# Patient Record
Sex: Male | Born: 1955 | Race: White | Hispanic: No | Smoking: Never smoker
Health system: Southern US, Community
[De-identification: ages and names within clinical notes are randomized; demographics above are authoritative.]

## PROBLEM LIST (undated history)

## (undated) DIAGNOSIS — I10 Essential (primary) hypertension: Secondary | ICD-10-CM

## (undated) DIAGNOSIS — G40909 Epilepsy, unspecified, not intractable, without status epilepticus: Secondary | ICD-10-CM

## (undated) HISTORY — PX: WRIST ARTHROCENTESIS: SUR48

## (undated) HISTORY — DX: Epilepsy, unspecified, not intractable, without status epilepticus: G40.909

## (undated) HISTORY — DX: Essential (primary) hypertension: I10

---

## 2013-03-22 DIAGNOSIS — N529 Male erectile dysfunction, unspecified: Secondary | ICD-10-CM

## 2013-03-22 HISTORY — DX: Male erectile dysfunction, unspecified: N52.9

## 2015-08-10 DIAGNOSIS — K219 Gastro-esophageal reflux disease without esophagitis: Secondary | ICD-10-CM

## 2015-08-10 HISTORY — DX: Gastro-esophageal reflux disease without esophagitis: K21.9

## 2015-10-13 DIAGNOSIS — I4510 Unspecified right bundle-branch block: Secondary | ICD-10-CM

## 2015-10-13 HISTORY — DX: Unspecified right bundle-branch block: I45.10

## 2016-09-03 DIAGNOSIS — N131 Hydronephrosis with ureteral stricture, not elsewhere classified: Secondary | ICD-10-CM | POA: Insufficient documentation

## 2016-09-03 DIAGNOSIS — N200 Calculus of kidney: Secondary | ICD-10-CM | POA: Insufficient documentation

## 2016-09-03 DIAGNOSIS — N201 Calculus of ureter: Secondary | ICD-10-CM

## 2016-09-03 HISTORY — DX: Calculus of kidney: N20.0

## 2016-09-03 HISTORY — DX: Calculus of ureter: N20.1

## 2016-09-03 HISTORY — DX: Hydronephrosis with ureteral stricture, not elsewhere classified: N13.1

## 2017-02-06 ENCOUNTER — Other Ambulatory Visit: Payer: Self-pay | Admitting: Urology

## 2017-02-06 DIAGNOSIS — Z87442 Personal history of urinary calculi: Secondary | ICD-10-CM

## 2017-02-06 DIAGNOSIS — R109 Unspecified abdominal pain: Secondary | ICD-10-CM

## 2017-02-06 DIAGNOSIS — R319 Hematuria, unspecified: Secondary | ICD-10-CM

## 2017-02-06 HISTORY — DX: Personal history of urinary calculi: Z87.442

## 2017-02-07 ENCOUNTER — Ambulatory Visit
Admission: RE | Admit: 2017-02-07 | Discharge: 2017-02-07 | Disposition: A | Discharge status: Home / Self Care | Payer: Managed Care, Other (non HMO) | Source: Ambulatory Visit | Attending: Urology | Admitting: Urology

## 2017-02-07 ENCOUNTER — Other Ambulatory Visit: Payer: Self-pay | Admitting: Urology

## 2017-02-07 DIAGNOSIS — Z87442 Personal history of urinary calculi: Secondary | ICD-10-CM

## 2017-02-07 DIAGNOSIS — R319 Hematuria, unspecified: Secondary | ICD-10-CM

## 2017-02-07 DIAGNOSIS — R109 Unspecified abdominal pain: Secondary | ICD-10-CM

## 2017-12-17 IMAGING — CT CT ABD-PELV W/O CM
1 of 2 series · 15 of 32 positions shown, 19 images · non-contrast
Comparison: None.

CLINICAL DATA: Left flank pain and hematuria for 3 days.
Nephrolithiasis.

EXAM:
CT ABDOMEN AND PELVIS WITHOUT CONTRAST
TECHNIQUE: Multidetector CT imaging of the abdomen and pelvis was performed
following the standard protocol without IV contrast.

[Series 2: renal standard/full · axial · 0.85mm/px · z∈[-565,-55]mm · 15 of 112 slices shown, 19 images]
[im 5/112  soft-tissue]
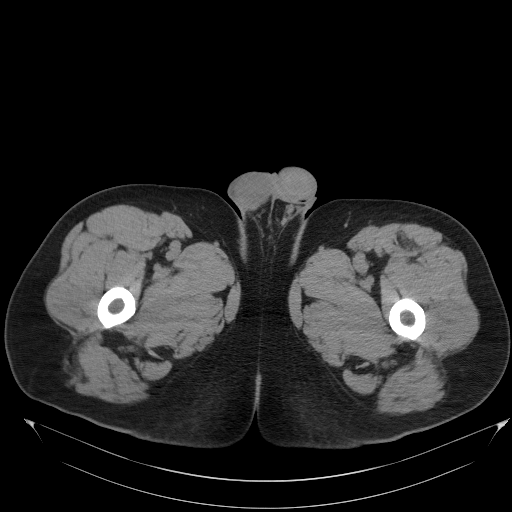
[im 5/112  bone]
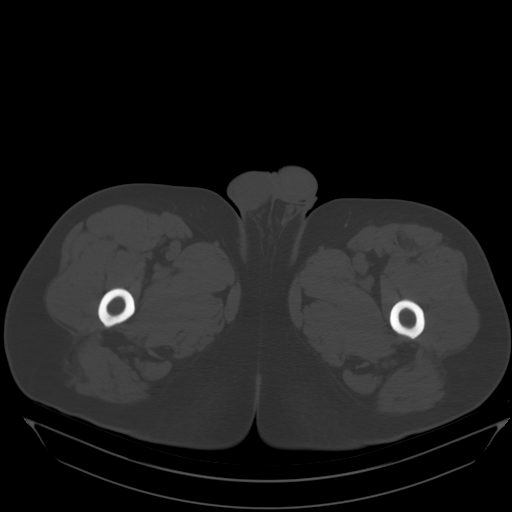
[im 13/112  soft-tissue]
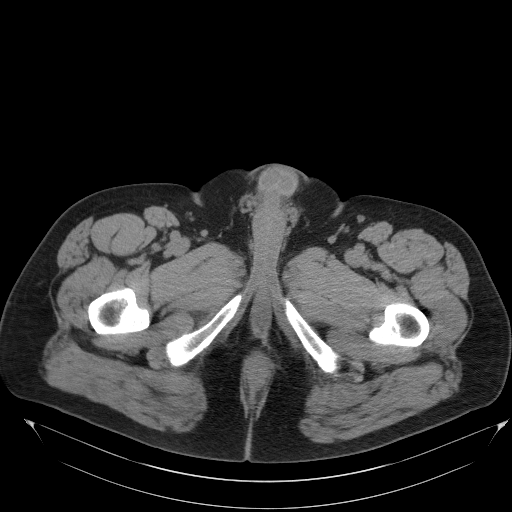
[im 22/112  soft-tissue]
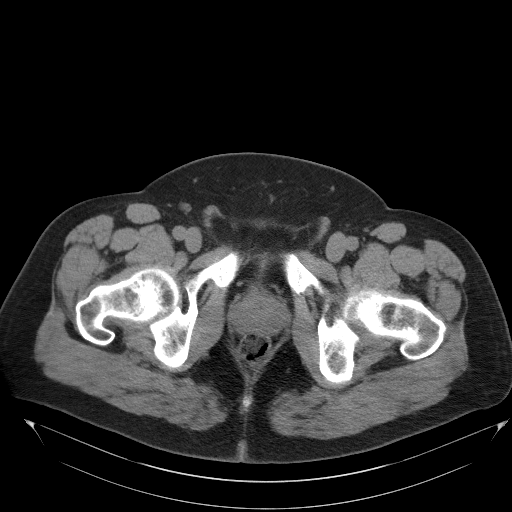
[im 30/112  soft-tissue]
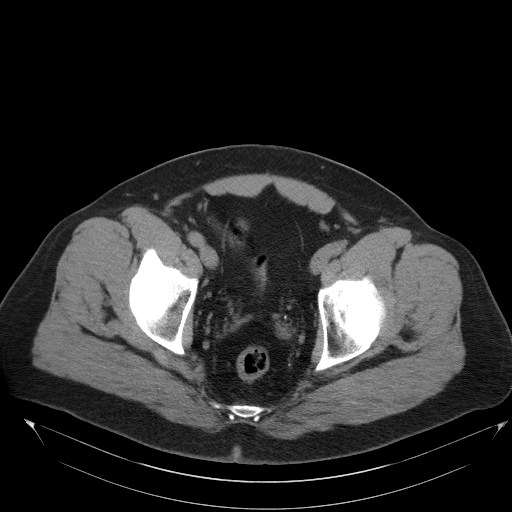
[im 39/112  soft-tissue]
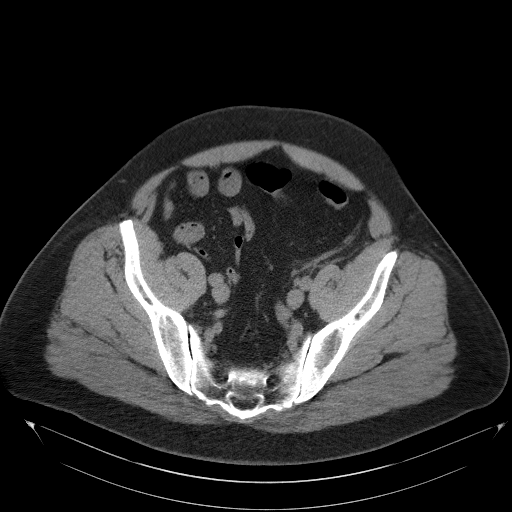
[im 47/112  soft-tissue]
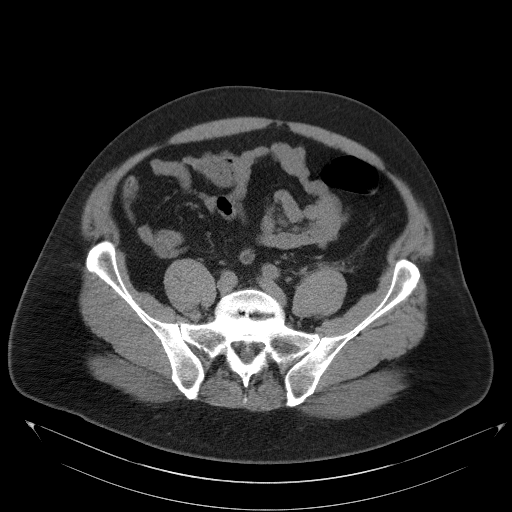
[im 56/112  soft-tissue]
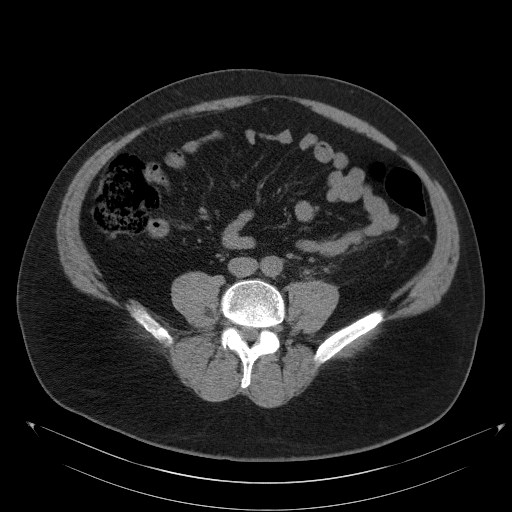
[im 65/112  soft-tissue]
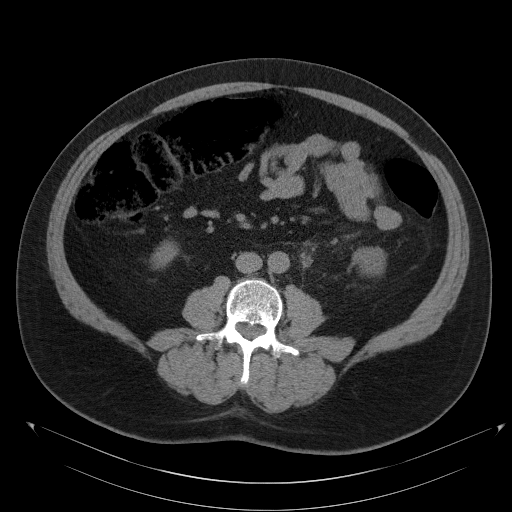
[im 73/112  soft-tissue]
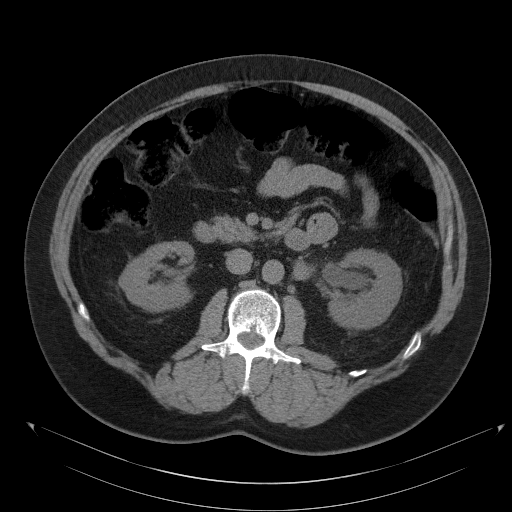
[im 73/112  bone]
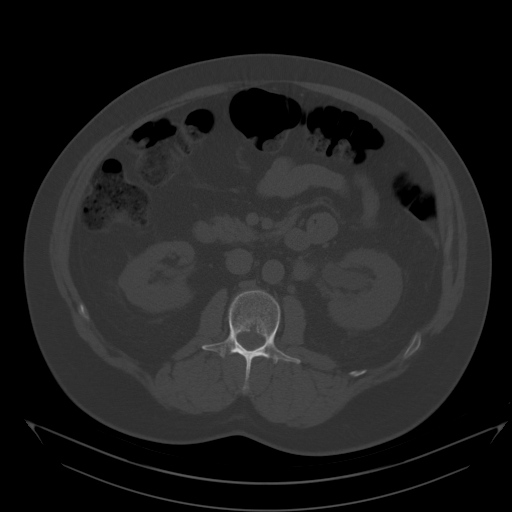
[im 82/112  soft-tissue]
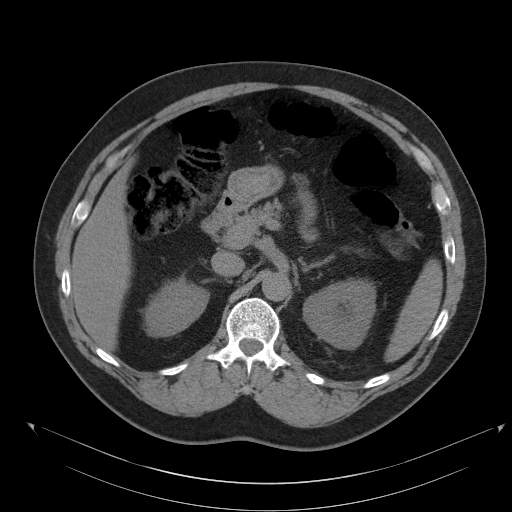
[im 90/112  soft-tissue]
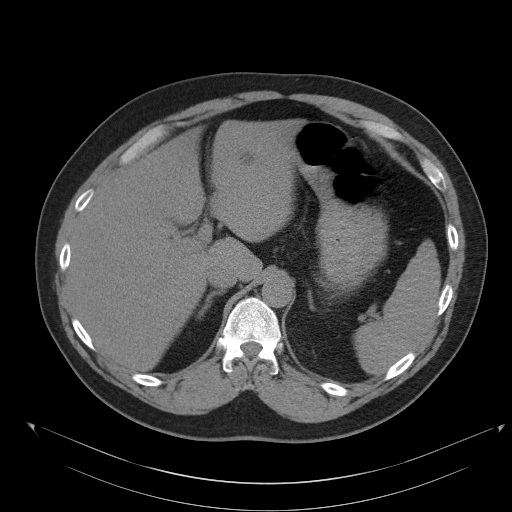
[im 94/112  lung]
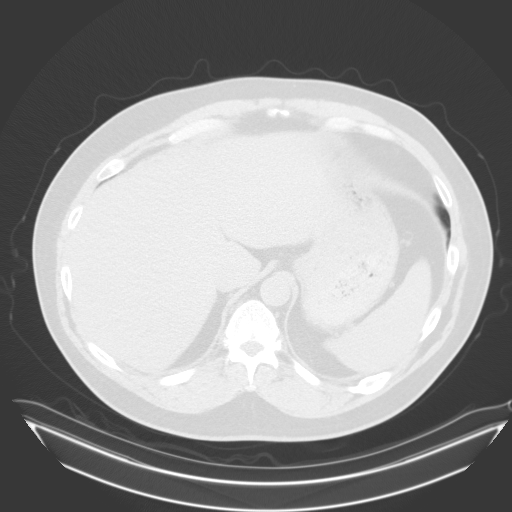
[im 99/112  soft-tissue]
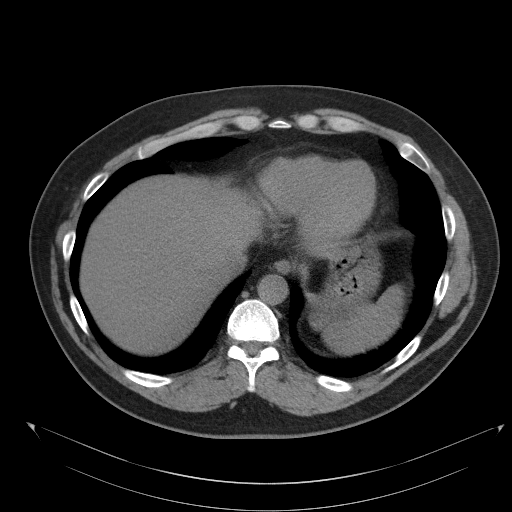
[im 99/112  lung]
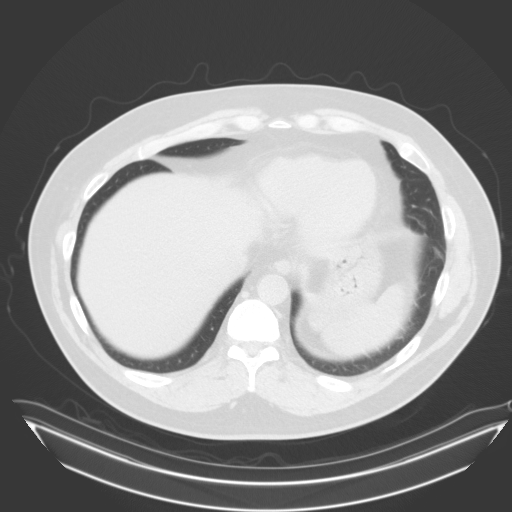
[im 103/112  lung]
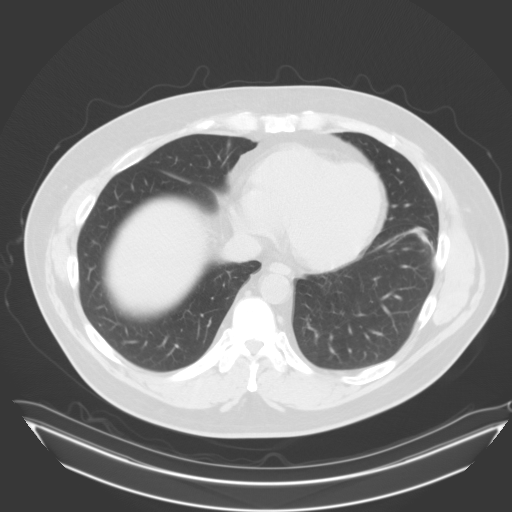
[im 107/112  soft-tissue]
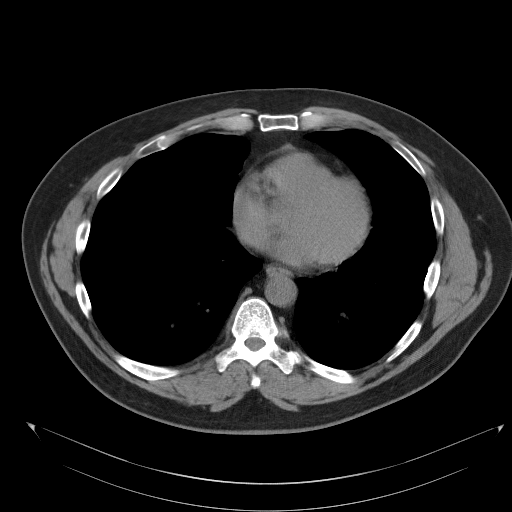
[im 107/112  lung]
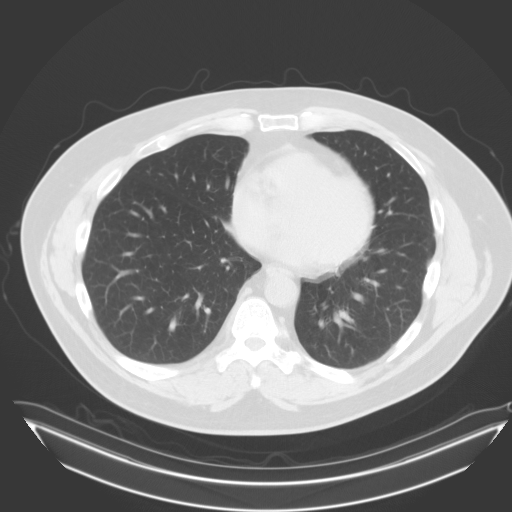

[15 of 32 positions shown; findings below may reference images not displayed]

FINDINGS: Lower chest: No acute findings.

Hepatobiliary: No masses visualized on this unenhanced exam. Several
tiny hepatic cysts noted. Gallbladder is unremarkable.

Pancreas: No mass or inflammatory process visualized on this
unenhanced exam.

Spleen:  Within normal limits in size.

Adrenals/Urinary tract: Mild left hydronephrosis is seen due to 2
adjacent calculi in the proximal left ureter, largest measuring 6
mm. Mild asymmetric left perinephric stranding. A few tiny 1-2 mm
nonobstructing right renal calculi are seen. Unremarkable
unopacified urinary bladder.

Stomach/Bowel: No evidence of obstruction, inflammatory process, or
abnormal fluid collections.

Vascular/Lymphatic: No pathologically enlarged lymph nodes
identified. No evidence of abdominal aortic aneurysm. Circumaortic
left renal vein incidentally noted.

Reproductive:  No mass or other significant abnormality.

Other:  None.

Musculoskeletal:  No suspicious bone lesions identified.
IMPRESSION: Mild left hydronephrosis due to proximal left ureteral calculi,
largest measuring 6 mm.

Tiny nonobstructing right renal calculi.

## 2019-06-06 DIAGNOSIS — N179 Acute kidney failure, unspecified: Secondary | ICD-10-CM

## 2019-06-06 DIAGNOSIS — N201 Calculus of ureter: Secondary | ICD-10-CM

## 2019-06-06 HISTORY — DX: Calculus of ureter: N20.1

## 2019-06-06 HISTORY — DX: Acute kidney failure, unspecified: N17.9

## 2020-09-27 DIAGNOSIS — R079 Chest pain, unspecified: Secondary | ICD-10-CM | POA: Insufficient documentation

## 2020-09-27 HISTORY — DX: Chest pain, unspecified: R07.9

## 2021-03-14 DIAGNOSIS — I10 Essential (primary) hypertension: Secondary | ICD-10-CM | POA: Insufficient documentation

## 2021-03-14 HISTORY — DX: Essential (primary) hypertension: I10

## 2021-06-19 ENCOUNTER — Other Ambulatory Visit: Payer: Self-pay

## 2021-06-19 ENCOUNTER — Encounter: Payer: Self-pay | Admitting: Nurse Practitioner

## 2021-06-19 ENCOUNTER — Ambulatory Visit (INDEPENDENT_AMBULATORY_CARE_PROVIDER_SITE_OTHER): Payer: 59 | Admitting: Nurse Practitioner

## 2021-06-19 VITALS — BP 134/70 | HR 68 | Temp 97.8°F | Ht 70.2 in | Wt 241.0 lb

## 2021-06-19 DIAGNOSIS — I10 Essential (primary) hypertension: Secondary | ICD-10-CM | POA: Diagnosis not present

## 2021-06-19 DIAGNOSIS — Z1211 Encounter for screening for malignant neoplasm of colon: Secondary | ICD-10-CM

## 2021-06-19 DIAGNOSIS — N529 Male erectile dysfunction, unspecified: Secondary | ICD-10-CM | POA: Diagnosis not present

## 2021-06-19 DIAGNOSIS — R9431 Abnormal electrocardiogram [ECG] [EKG]: Secondary | ICD-10-CM | POA: Diagnosis not present

## 2021-06-19 DIAGNOSIS — H6123 Impacted cerumen, bilateral: Secondary | ICD-10-CM

## 2021-06-19 DIAGNOSIS — E6609 Other obesity due to excess calories: Secondary | ICD-10-CM | POA: Diagnosis not present

## 2021-06-19 DIAGNOSIS — Z7689 Persons encountering health services in other specified circumstances: Secondary | ICD-10-CM

## 2021-06-19 DIAGNOSIS — Z0001 Encounter for general adult medical examination with abnormal findings: Secondary | ICD-10-CM

## 2021-06-19 DIAGNOSIS — Z6834 Body mass index (BMI) 34.0-34.9, adult: Secondary | ICD-10-CM

## 2021-06-19 DIAGNOSIS — Z0000 Encounter for general adult medical examination without abnormal findings: Secondary | ICD-10-CM

## 2021-06-19 DIAGNOSIS — E66811 Obesity, class 1: Secondary | ICD-10-CM

## 2021-06-19 LAB — POCT URINALYSIS DIPSTICK
Bilirubin, UA: NEGATIVE
Blood, UA: NEGATIVE
Glucose, UA: NEGATIVE
Ketones, UA: NEGATIVE
Leukocytes, UA: NEGATIVE
Nitrite, UA: NEGATIVE
Protein, UA: NEGATIVE
Spec Grav, UA: >=1.03 — AB (ref 1.010–1.025)
Urobilinogen, UA: 0.2 E.U./dL
pH, UA: 5.5 (ref 5.0–8.0)

## 2021-06-19 LAB — POCT UA - MICROALBUMIN
Albumin/Creatinine Ratio, Urine, POC: <30
Creatinine, POC: 200 mg/dL
Microalbumin Ur, POC: 30 mg/L

## 2021-06-19 MED ORDER — TADALAFIL 20 MG PO TABS
ORAL_TABLET | ORAL | 0 refills | Status: AC
Start: 2021-06-19 — End: ?

## 2021-06-19 NOTE — Progress Notes (Signed)
I,Tianna Badgett,acting as a Education administrator for [employer], NP.,have documented all relevant documentation on the behalf of [employer], NP,as directed by  Bary Castilla, NP while in the presence of Bary Castilla, NP.  This visit occurred during the SARS-CoV-2 public health emergency.  Safety protocols were in place, including screening questions prior to the visit, additional usage of staff PPE, and extensive cleaning of exam room while observing appropriate contact time as indicated for disinfecting solutions.  Subjective:     Patient ID: Gerald Conner , male    DOB: [birth year] , 65 y.o.   MRN: [id number]   Chief Complaint  Patient presents with   Establish Care    HPI  He is here establish are here due to his insurance. He has epilepsy which is under control. He is requesting a medication refill for Cialis. He was taking 10 mg and that was not working for him so he would like to increase his dosage No other concerns today.   Diet: He does not eat a lot of pork. But does eat chicken. He eats oatmeal and eggs. He rarely eats pizza or bread.  Exercise: he goes to the gym twice a week.  Drink or smoke: no       Past Medical History:  Diagnosis Date   Epilepsy ([place])    Hypertension      Family History  Problem Relation Age of Onset   Diabetes Mother    Heart disease Mother    Diabetes Sister    Diabetes Brother      Current Outpatient Medications:    amLODipine (NORVASC) 5 MG tablet, Take 1 tablet by mouth daily., Disp: , Rfl:    tadalafil (CIALIS) 20 MG tablet, Take 1 tablet by mouth daily as needed for erectile dysfunction., Disp: 10 tablet, Rfl: 0   Oxcarbazepine (TRILEPTAL) 300 MG tablet, Take 300 mg by mouth 2 (two) times daily., Disp: , Rfl:    Allergies  Allergen Reactions   Other Anaphylaxis    Tilapia      Men's preventive visit. Patient Health Questionnaire (PHQ-2) is  Flowsheet Row Office Visit from 06/19/2021 in [facility]  PHQ-2 Total Score 0     . Patient is on a healthy diet. Marital status: Single. Relevant history for alcohol use is:  Social History   Substance and Sexual Activity  Alcohol Use Not Currently  . Relevant history for tobacco use is:  Social History   Tobacco Use  Smoking Status Never  Smokeless Tobacco Never  .   Review of Systems  Constitutional: Negative.  Negative for chills, fatigue and fever.  HENT:  Negative for congestion.   Respiratory: Negative.  Negative for cough, shortness of breath and wheezing.   Cardiovascular: Negative.  Negative for chest pain and palpitations.  Gastrointestinal: Negative.  Negative for abdominal distention, abdominal pain, constipation and diarrhea.  Genitourinary:  Negative for decreased urine volume, difficulty urinating and urgency.  Musculoskeletal:  Negative for arthralgias and myalgias.  Neurological: Negative.  Negative for dizziness, weakness and headaches.  Psychiatric/Behavioral: Negative.      Today's Vitals   06/19/21 0903  BP: 134/70  Pulse: 68  Temp: 97.8 F (36.6 C)  TempSrc: Oral  Weight: 241 lb (109.3 kg)  Height: 5' 10.2" (1.783 m)   Body mass index is 34.38 kg/m.  Wt Readings from Last 3 Encounters:  06/19/21 241 lb (109.3 kg)    Objective:  Physical Exam Vitals and nursing note reviewed.  Constitutional:  Appearance: Normal appearance.  HENT:     Head: Normocephalic and atraumatic.     Right Ear: Tympanic membrane, ear canal and external ear normal. There is impacted cerumen.     Left Ear: Tympanic membrane, ear canal and external ear normal. There is impacted cerumen.     Nose: Nose normal.     Mouth/Throat:     Mouth: Mucous membranes are moist.     Pharynx: Oropharynx is clear.  Eyes:     Extraocular Movements: Extraocular movements intact.     Conjunctiva/sclera: Conjunctivae normal.     Pupils: Pupils are equal, round, and reactive to light.  Cardiovascular:     Rate and Rhythm:  Normal rate and regular rhythm.     Pulses: Normal pulses.     Heart sounds: Normal heart sounds.  Pulmonary:     Effort: Pulmonary effort is normal. No respiratory distress.     Breath sounds: Normal breath sounds. No wheezing.  Chest:  Breasts:    Right: Normal. No swelling, bleeding, inverted nipple, mass or nipple discharge.     Left: Normal. No swelling, bleeding, inverted nipple, mass or nipple discharge.  Abdominal:     General: Abdomen is flat. Bowel sounds are normal.     Palpations: Abdomen is soft.     Tenderness: There is no abdominal tenderness.  Genitourinary:    Prostate: Normal.     Rectum: Normal. Guaiac result negative.     Comments: Will check PSA today  Musculoskeletal:        General: Normal range of motion.     Cervical back: Normal range of motion and neck supple.  Skin:    General: Skin is warm and dry.     Capillary Refill: Capillary refill takes less than 2 seconds.  Neurological:     General: No focal deficit present.     Mental Status: He is alert and oriented to person, place, and time.  Psychiatric:        Mood and Affect: Mood normal.        Behavior: Behavior normal.        Assessment And Plan:    1. Establishing care with new doctor, encounter for --Patient is here to establish care. Martin Majestic over patient medical, family, social and surgical history. -Reviewed with patient their medications and any allergies  -Reviewed with patient their sexual orientation, drug/tobacco and alcohol use -Dicussed any new concerns with patient  -recommended patient comes in for a physical exam and complete blood work.  -Educated patient about the importance of annual screenings and immunizations.  -Advised patient to eat a healthy diet along with exercise for atleast 30-45 min atleast 4-5 days of the week.    2. Encounter for annual physical exam --Patient is here for their annual physical exam and we discussed any changes to medication and medical history.   -Behavior modification was discussed as well as diet and exercise history  -Patient will continue to exercise regularly and modify their diet.  -Recommendation for yearly physical annuals, immunization and screenings including mammogram and colonoscopy were discussed with the patient.  -Recommended intake of multivitamin, vitamin D and calcium.  -Individualized advise was given to the patient pertaining to their own health history in regards to diet, exercise, medical condition and referrals.  - Hemoglobin A1c - Lipid panel - Hepatitis C antibody - PSA - HIV Antibody (routine testing w rflx) - POCT Urinalysis Dipstick (81002) - POCT UA - Microalbumin - EKG 12-Lead  3. Essential  hypertension, benign -Chronic, continue amlodipine  8m daily -Limit the intake of processed foods and salt intake. You should increase your intake of green vegetables and fruits. Limit the use of alcohol. Limit fast foods and fried foods. Avoid high fatty saturated and trans fat foods. Keep yourself hydrated with drinking water. Avoid red meats. Eat lean meats instead. Exercise for atleast 30-45 min for atleast 4-5 times a week.  - CBC - CMP14+EGFR - POCT Urinalysis Dipstick (81002) - POCT UA - Microalbumin - EKG 12-Lead  4. Abnormal EKG - Ambulatory referral to Cardiology  5. Class 1 obesity due to excess calories with serious comorbidity and body mass index (BMI) of 34.0 to 34.9 in adult -Advised patient on a healthy diet including avoiding fast food and red meats. Increase the intake of lean meats including grilled chicken and [place]  Drink a lot of water. Decrease intake of fatty foods. Exercise for 30-45 min. 4-5 a week to decrease the risk of cardiac event.    6. Bilateral impacted cerumen -used a curette in both ears to clean and flushed   7. Erectile dysfunction, unspecified erectile dysfunction type - tadalafil (CIALIS) 20 MG tablet; Take 1 tablet by mouth daily as needed for erectile  dysfunction.  Dispense: 10 tablet; Refill: 0  8. Screen for colon cancer - Cologuard  He is encouraged to initially strive for BMI less than 30 to decrease cardiac risk. He is advised to exercise no less than 150 minutes per week.   The patient was encouraged to call or send a message through [email or web address] any questions or concerns.   Follow up: if symptoms persist or do not get better.   Side effects and appropriate use of all the medication(s) were discussed with the patient today. Patient advised to use the medication(s) as directed by their healthcare provider. The patient was encouraged to read, review, and understand all associated package inserts and contact our office with any questions or concerns. The patient accepts the risks of the treatment plan and had an opportunity to ask questions.   Staying healthy and adopting a healthy lifestyle for your overall health is important. You should eat 7 or more servings of fruits and vegetables per day. You should drink plenty of water to keep yourself hydrated and your kidneys healthy. This includes about 65-80+ fluid ounces of water. Limit your intake of animal fats especially for elevated cholesterol. Avoid highly processed food and limit your salt intake if you have hypertension. Avoid foods high in saturated/Trans fats. Along with a healthy diet it is also very important to maintain time for yourself to maintain a healthy mental health with low stress levels. You should get atleast 150 min of moderate intensity exercise weekly for a healthy heart. Along with eating right and exercising, aim for at least 7-9 hours of sleep daily.  Eat more whole grains which includes barley, wheat berries, oats, brown rice and whole wheat pasta. Use healthy plant oils which include olive, soy, corn, sunflower and peanut. Limit your caffeine and sugary drinks. Limit your intake of fast foods. Limit milk and dairy products to one or two daily servings.   Patient was  given opportunity to ask questions. Patient verbalized understanding of the plan and was able to repeat key elements of the plan. All questions were answered to their satisfaction.  Raman Ghumman, DNP   I, Raman Ghumman have reviewed all documentation for this visit. The documentation on 06/19/21 for the exam, diagnosis, procedures, and orders are  all accurate and complete.   THE PATIENT IS ENCOURAGED TO PRACTICE SOCIAL DISTANCING DUE TO THE COVID-19 PANDEMIC.

## 2021-06-20 LAB — CBC
Hematocrit: 47.5 % (ref 37.5–51.0)
Hemoglobin: 16 g/dL (ref 13.0–17.7)
MCH: 32.6 pg (ref 26.6–33.0)
MCHC: 33.7 g/dL (ref 31.5–35.7)
MCV: 97 fL (ref 79–97)
Platelets: 194 10*3/uL (ref 150–450)
RBC: 4.91 x10E6/uL (ref 4.14–5.80)
RDW: 12.4 % (ref 11.6–15.4)
WBC: 5.1 10*3/uL (ref 3.4–10.8)

## 2021-06-20 LAB — PSA: Prostate Specific Ag, Serum: 1.3 ng/mL (ref 0.0–4.0)

## 2021-06-20 LAB — HEMOGLOBIN A1C
Est. average glucose Bld gHb Est-mCnc: 117 mg/dL
Hgb A1c MFr Bld: 5.7 % — ABNORMAL HIGH (ref 4.8–5.6)

## 2021-06-20 LAB — CMP14+EGFR
ALT: 14 IU/L (ref 0–44)
AST: 14 IU/L (ref 0–40)
Albumin/Globulin Ratio: 1.9 (ref 1.2–2.2)
Albumin: 4.5 g/dL (ref 3.8–4.8)
Alkaline Phosphatase: 87 IU/L (ref 44–121)
BUN/Creatinine Ratio: 22 (ref 10–24)
BUN: 26 mg/dL (ref 8–27)
Bilirubin Total: <0.2 mg/dL (ref 0.0–1.2)
CO2: 19 mmol/L — ABNORMAL LOW (ref 20–29)
Calcium: 9.8 mg/dL (ref 8.6–10.2)
Chloride: 107 mmol/L — ABNORMAL HIGH (ref 96–106)
Creatinine, Ser: 1.16 mg/dL (ref 0.76–1.27)
Globulin, Total: 2.4 g/dL (ref 1.5–4.5)
Glucose: 87 mg/dL (ref 70–99)
Potassium: 4.7 mmol/L (ref 3.5–5.2)
Sodium: 144 mmol/L (ref 134–144)
Total Protein: 6.9 g/dL (ref 6.0–8.5)
eGFR: 70 mL/min/{1.73_m2} (ref >59)

## 2021-06-20 LAB — LIPID PANEL
Chol/HDL Ratio: 3.9 ratio (ref 0.0–5.0)
Cholesterol, Total: 148 mg/dL (ref 100–199)
HDL: 38 mg/dL — ABNORMAL LOW (ref >39)
LDL Chol Calc (NIH): 66 mg/dL (ref 0–99)
Triglycerides: 273 mg/dL — ABNORMAL HIGH (ref 0–149)
VLDL Cholesterol Cal: 44 mg/dL — ABNORMAL HIGH (ref 5–40)

## 2021-06-20 LAB — HIV ANTIBODY (ROUTINE TESTING W REFLEX): HIV Screen 4th Generation wRfx: NONREACTIVE

## 2021-06-20 LAB — HEPATITIS C ANTIBODY: Hep C Virus Ab: <0.1 s/co ratio (ref 0.0–0.9)

## 2021-07-07 LAB — COLOGUARD: COLOGUARD: NEGATIVE

## 2021-07-23 DIAGNOSIS — G40909 Epilepsy, unspecified, not intractable, without status epilepticus: Secondary | ICD-10-CM | POA: Insufficient documentation

## 2021-07-23 DIAGNOSIS — I10 Essential (primary) hypertension: Secondary | ICD-10-CM | POA: Insufficient documentation

## 2021-07-23 DIAGNOSIS — G629 Polyneuropathy, unspecified: Secondary | ICD-10-CM

## 2021-07-23 HISTORY — DX: Polyneuropathy, unspecified: G62.9

## 2021-07-24 ENCOUNTER — Other Ambulatory Visit: Payer: Self-pay

## 2021-07-24 ENCOUNTER — Ambulatory Visit: Payer: 59 | Admitting: Cardiology

## 2021-07-24 ENCOUNTER — Encounter: Payer: Self-pay | Admitting: Cardiology

## 2021-07-24 VITALS — BP 124/76 | HR 71 | Ht 72.6 in | Wt 240.0 lb

## 2021-07-24 DIAGNOSIS — I10 Essential (primary) hypertension: Secondary | ICD-10-CM

## 2021-07-24 DIAGNOSIS — R9431 Abnormal electrocardiogram [ECG] [EKG]: Secondary | ICD-10-CM | POA: Diagnosis not present

## 2021-07-24 DIAGNOSIS — E66811 Obesity, class 1: Secondary | ICD-10-CM | POA: Insufficient documentation

## 2021-07-24 DIAGNOSIS — E669 Obesity, unspecified: Secondary | ICD-10-CM

## 2021-07-24 DIAGNOSIS — R0789 Other chest pain: Secondary | ICD-10-CM | POA: Insufficient documentation

## 2021-07-24 DIAGNOSIS — E781 Pure hyperglyceridemia: Secondary | ICD-10-CM | POA: Diagnosis not present

## 2021-07-24 MED ORDER — AMLODIPINE BESYLATE 2.5 MG PO TABS: 2.5000 mg | ORAL_TABLET | Freq: Every day | ORAL | 3 refills | Status: AC

## 2021-07-24 NOTE — Patient Instructions (Signed)
Medication Instructions:  Your physician has recommended you make the following change in your medication:   Decrease your Amlodipine to 2.5 mg daily. Take 1/2 tablet of your current dose.  *If you need a refill on your cardiac medications before your next appointment, please call your pharmacy*   Lab Work: None ordered If you have labs (blood work) drawn today and your tests are completely normal, you will receive your results only by: MyChart Message (if you have MyChart) OR A paper copy in the mail If you have any lab test that is abnormal or we need to change your treatment, we will call you to review the results.   Testing/Procedures:  We will order CT coronary calcium score. It will cost $99.00 and is not covered by insurance.  Please call [phone] to schedule.   [facility]  [address]  [place], [place] [address]      Stress Echocardiogram Information Sheet                                                      Instructions:    1. You may take your morning medications the morning of the test  2. Light breakfast no caffeine  3. Dress prepared to exercise.  4. DO NOT use ANY caffeine or tobacco products 3 hours before appointment.  5. Please bring all current prescription medications.   Follow-Up: At [facility], you and your health needs are our priority.  As part of our continuing mission to provide you with exceptional heart care, we have created designated Provider Care Teams.  These Care Teams include your primary Cardiologist (physician) and Advanced Practice Providers (APPs -  Physician Assistants and Nurse Practitioners) who all work together to provide you with the care you need, when you need it.  We recommend signing up for the patient portal called "MyChart".  Sign up information is provided on this After Visit Summary.  MyChart is used to connect with patients for Virtual Visits (Telemedicine).  Patients are able to view lab/test  results, encounter notes, upcoming appointments, etc.  Non-urgent messages can be sent to your provider as well.   To learn more about what you can do with MyChart, go to [email or web address].    Your next appointment:   6 month(s)  The format for your next appointment:   In Person  Provider:   Belva Crome, MD   Other Instructions  Coronary Calcium Scan A coronary calcium scan is an imaging test used to look for deposits of plaque in the inner lining of the blood vessels of the heart (coronary arteries). Plaque is made up of calcium, protein, and fatty substances. These deposits of plaque can partly clog and narrow the coronary arteries without producing any symptoms or warning signs. This puts a person at risk for a heart attack. This test is recommended for people who are at moderate risk for heart disease. The test can find plaque deposits before symptoms develop. Tell a health care provider about: Any allergies you have. All medicines you are taking, including vitamins, herbs, eye drops, creams, and over-the-counter medicines. Any problems you or family members have had with anesthetic medicines. Any blood disorders you have. Any surgeries you have had. Any medical conditions you have. Whether you are pregnant or may be pregnant. What are  the risks? Generally, this is a safe procedure. However, problems may occur, including: Harm to a pregnant woman and her unborn baby. This test involves the use of radiation. Radiation exposure can be dangerous to a pregnant woman and her unborn baby. If you are pregnant or think you may be pregnant, you should not have this procedure done. Slight increase in the risk of cancer. This is because of the radiation involved in the test. What happens before the procedure? Ask your health care provider for any specific instructions on how to prepare for this procedure. You may be asked to avoid products that contain caffeine, tobacco, or nicotine  for 4 hours before the procedure. What happens during the procedure? You will undress and remove any jewelry from your neck or chest. You will put on a hospital gown. Sticky electrodes will be placed on your chest. The electrodes will be connected to an electrocardiogram (ECG) machine to record a tracing of the electrical activity of your heart. You will lie down on a curved bed that is attached to the CT scanner. You may be given medicine to slow down your heart rate so that clear pictures can be created. You will be moved into the CT scanner, and the CT scanner will take pictures of your heart. During this time, you will be asked to lie still and hold your breath for 2-3 seconds at a time while each picture of your heart is being taken. The procedure may vary among health care providers and hospitals.    What happens after the procedure? You can get dressed. You can return to your normal activities. It is up to you to get the results of your procedure. Ask your health care provider, or the department that is doing the procedure, when your results will be ready. Summary A coronary calcium scan is an imaging test used to look for deposits of plaque in the inner lining of the blood vessels of the heart (coronary arteries). Plaque is made up of calcium, protein, and fatty substances. Generally, this is a safe procedure. Tell your health care provider if you are pregnant or may be pregnant. Ask your health care provider for any specific instructions on how to prepare for this procedure. A CT scanner will take pictures of your heart. You can return to your normal activities after the scan is done. This information is not intended to replace advice given to you by your health care provider. Make sure you discuss any questions you have with your health care provider. Document Revised: 03/16/2019 Document Reviewed: 03/16/2019 Elsevier Patient Education  2021 Elsevier Inc.   Exercise Stress  Echocardiogram An exercise stress echocardiogram is a test to check how well your heart is working. This test uses sound waves and a computer to make pictures of your heart. These pictures will be taken before and after you exercise. For this test, you will walk on a treadmill or ride a bicycle to make your heart beat faster. While you exercise, your heart will be checked with an electrocardiogram (ECG). Your blood pressure will also be checked. You may have this test if: You have chest pain or a heart problem. You had a heart attack or heart surgery not long ago. You have heart valve problems. You have a condition that causes narrowing of the blood vessels that supply your heart. You have a high risk of heart disease and: You are starting a new exercise program. You need to have a big surgery. Tell a  doctor about: Any allergies you have. All medicines you are taking. This includes vitamins, herbs, eye drops, creams, and over-the-counter medicines. Any problems you or family members have had with medicines that make you fall asleep (anesthetic medicines). Any surgeries you have had. Any blood disorders you have. Any medical conditions you have. Whether you are pregnant or may be pregnant. What are the risks? Generally, this is a safe test. However, problems may occur, including: Chest pain. Feeling dizzy or light-headed. Shortness of breath. Increased or irregular heartbeat. Feeling like you may vomit (nausea) or vomiting. Heart attack. This is very rare. What happens before the test? Medicines Ask your doctor about changing or stopping your normal medicines. This is important if you take diabetes medicines or blood thinners. If you use an inhaler, bring it to the test. General instructions Wear comfortable clothes and walking shoes. Follow instructions from your doctor about what you cannot eat or drink before the test. Do not drink or eat anything that has caffeine in it. Stop  having caffeine 24 hours before the test. Do not smoke or use products that contain nicotine or tobacco for 4 hours before the test. If you need help quitting, ask your doctor. What happens during the test?  You will take off your clothes from the waist up and put on a hospital gown. Electrodes or patches will be put on your chest. A blood pressure cuff will be put on your arm. Before you exercise, a computer will make a picture of your heart. To do this: You will lie down and a gel will be put on your chest. A wand will be moved over the gel. Sound waves from the wand will go to the computer to make the picture. Then, you will start to exercise. You may walk on a treadmill or pedal a bicycle. Your blood pressure and heart rhythm will be checked while you exercise. The exercise will get harder or faster. You will exercise until: Your heart reaches a certain level. You are too tired to go on. You cannot go on because of chest pain, weakness, or dizziness. You will lie down right away so another picture of your heart can be taken. The procedure may vary among doctors and hospitals. What can I expect after the test? After your test, it is common to have: Mild soreness. Mild tiredness. Your heart rate and blood pressure will be checked until they return to your normal levels. You should not have any new symptoms after this test. Follow these instructions at home: If your doctor says that you can, you may: Eat what you normally eat. Do your normal activities. Take over-the-counter and prescription medicines only as told by your doctor. Keep all follow-up visits. It is up to you to get the results of your test. Ask how to get your results when they are ready. Contact a doctor if: You feel dizzy or light-headed. You have a fast or irregular heartbeat. You feel like you may vomit or you vomit. You have a headache. You feel short of breath. Get help right away if: You develop pain or  pressure: In your chest. In your jaw or neck. Between your shoulders. That goes down your left arm. You faint. You have trouble breathing. These symptoms may be an emergency. Get medical help right away. Call your local emergency services (911 in the [place] Do not wait to see if the symptoms will go away. Do not drive yourself to the hospital. Summary This is a  test that checks how well your heart is working. Follow instructions about what you cannot eat or drink before the test. Ask your doctor if you should take your normal medicines before the test. Stop having caffeine 24 hours before the test. Do not smoke or use products with nicotine or tobacco in them for 4 hours before the test. During the test, your blood pressure and heart rhythm will be checked while you exercise. This information is not intended to replace advice given to you by your health care provider. Make sure you discuss any questions you have with your health care provider. Document Revised: 05/09/2021 Document Reviewed: 04/18/2020 Elsevier Patient Education  2022 [employer].

## 2021-07-24 NOTE — Progress Notes (Signed)
Cardiology Office Note:    Date:  07/24/2021   ID:  Gerald Conner, DOB 27-Sep-1955, MRN [id number]  PCP:  Charlesetta Ivory, NP  Cardiologist:  Garwin Brothers, MD   Referring MD: Charlesetta Ivory, NP    ASSESSMENT:    1. Benign essential HTN   2. Chest discomfort   3. Abnormal EKG   4. Hypertriglyceridemia   5. Obesity (BMI 30.0-34.9)    PLAN:    In order of problems listed above:  Primary prevention stressed with the patient.  Importance of compliance with diet medication stressed and vocalized understanding. Essential hypertension: His blood pressure is borderline.  He tells me that he has some dizziness at times.  Occasionally when he changes posture.  In view of this I have cut down his blood pressure medication amlodipine to 2.5 mg daily and he will keep a track of his blood pressure.  Lifestyle modification and salt intake issues were discussed and he understands. Chest discomfort: Atypical in nature and in view of EKG abnormalities we will do an exercise stress echo and is agreeable. Hypertriglyceridemia: Diet was emphasized.  Patient promises to do better.  He is going to work with diet and exercise.  I told him to start exercising once his stress test results came back. Obesity: Weight reduction stressed diet was emphasized.  He promises to do better.  For coronary restratification he is agreeable to get a CT calcium scoring done. He knows to go to the nearest emergency room for any significant symptoms.Patient will be seen in follow-up appointment in 6 months or earlier if the patient has any concerns    Medication Adjustments/Labs and Tests Ordered: Current medicines are reviewed at length with the patient today.  Concerns regarding medicines are outlined above.  No orders of the defined types were placed in this encounter.  No orders of the defined types were placed in this encounter.    History of Present Illness:    Gerald Conner is a 65 y.o. male who is  being seen today for the evaluation of abnormal EKG, chest discomfort, essential hypertension at the request of Charlesetta Ivory, NP.  Patient is a pleasant 65 year old male.  He has past medical history of essential hypertension and hypertriglyceridemia.  Overall he leads a sedentary lifestyle.  He mentions to me that he goes to the gym about 2 times a week.  With this he has no significant symptoms.  He has chest discomfort at times this is not related to exertion.  Because of EKG abnormalities and his symptoms he was sent here for evaluation.  He is a retired gentleman.  At the time of my evaluation, the patient is alert awake oriented and in no distress.  Past Medical History:  Diagnosis Date   Acid reflux 08/10/2015   AKI (acute kidney injury) (HCC) 06/06/2019   Benign essential HTN 03/14/2021   Chest pain 09/27/2020   Epilepsy (HCC)    Erectile dysfunction 03/22/2013   History of kidney stones 02/06/2017   Hydronephrosis due to obstruction of ureter 09/03/2016   Hypertension    Left ureteral calculus 09/03/2016   Nephrolith 09/03/2016   Neuropathy 07/23/2021   Obstruction of right ureteropelvic junction (UPJ) due to stone 06/06/2019   RBBB 10/13/2015    Past Surgical History:  Procedure Laterality Date   WRIST ARTHROCENTESIS      Current Medications: Current Meds  Medication Sig   amLODipine (NORVASC) 5 MG tablet Take 1 tablet by mouth daily.   Oxcarbazepine (TRILEPTAL) 300  MG tablet Take 300 mg by mouth 2 (two) times daily.   tadalafil (CIALIS) 20 MG tablet Take 1 tablet by mouth daily as needed for erectile dysfunction.     Allergies:   Shellfish allergy   Social History   Socioeconomic History   Marital status: Single    Spouse name: Not on file   Number of children: Not on file   Years of education: Not on file   Highest education level: Not on file  Occupational History   Not on file  Tobacco Use   Smoking status: Never   Smokeless tobacco: Never  Vaping Use    Vaping Use: Never used  Substance and Sexual Activity   Alcohol use: Not Currently   Drug use: Not Currently   Sexual activity: Yes    Birth control/protection: None  Other Topics Concern   Not on file  Social History Narrative   Not on file   Social Determinants of Health   Financial Resource Strain: Not on file  Food Insecurity: Not on file  Transportation Needs: Not on file  Physical Activity: Not on file  Stress: Not on file  Social Connections: Not on file     Family History: The patient's family history includes Diabetes in his brother, mother, and sister; Heart disease in his mother.  ROS:   Please see the history of present illness.    All other systems reviewed and are negative.  EKGs/Labs/Other Studies Reviewed:    The following studies were reviewed today: EKG reveals sinus rhythm left anterior hemiblock and nonspecific ST-T changes   Recent Labs: 06/19/2021: ALT 14; BUN 26; Creatinine, Ser 1.16; Hemoglobin 16.0; Platelets 194; Potassium 4.7; Sodium 144  Recent Lipid Panel    Component Value Date/Time   CHOL 148 06/19/2021 0958   TRIG 273 (H) 06/19/2021 0958   HDL 38 (L) 06/19/2021 0958   CHOLHDL 3.9 06/19/2021 0958   LDLCALC 66 06/19/2021 0958    Physical Exam:    VS:  BP 124/76   Pulse 71   Ht 6' 0.6" (1.844 m)   Wt 240 lb 0.6 oz (108.9 kg)   SpO2 97%   BMI 32.02 kg/m     Wt Readings from Last 3 Encounters:  07/24/21 240 lb 0.6 oz (108.9 kg)  06/19/21 241 lb (109.3 kg)     GEN: Patient is in no acute distress HEENT: Normal NECK: No JVD; No carotid bruits LYMPHATICS: No lymphadenopathy CARDIAC: S1 S2 regular, 2/6 systolic murmur at the apex. RESPIRATORY:  Clear to auscultation without rales, wheezing or rhonchi  ABDOMEN: Soft, non-tender, non-distended MUSCULOSKELETAL:  No edema; No deformity  SKIN: Warm and dry NEUROLOGIC:  Alert and oriented x 3 PSYCHIATRIC:  Normal affect    Signed, Garwin Brothers, MD  07/24/2021 9:08 AM     [facility]

## 2021-08-14 ENCOUNTER — Telehealth (HOSPITAL_COMMUNITY): Payer: Self-pay | Admitting: *Deleted

## 2021-08-14 NOTE — Telephone Encounter (Signed)
Left message on voicemail per DPR in reference to upcoming appointment scheduled on 1/212/22 at 2:00 with detailed instructions given per Stress Test Requisition Sheet for the test. LM to arrive 30 minutes early, and that it is imperative to arrive on time for appointment to keep from having the test rescheduled. If you need to cancel or reschedule your appointment, please call the office within 24 hours of your appointment. Failure to do so may result in a cancellation of your appointment, and a $50 no show fee. Phone number given for call back for any questions. Daneil Dolin

## 2021-08-20 ENCOUNTER — Ambulatory Visit (HOSPITAL_COMMUNITY): Payer: 59

## 2021-08-20 ENCOUNTER — Ambulatory Visit (HOSPITAL_COMMUNITY): Payer: 59 | Attending: Cardiology

## 2021-08-20 ENCOUNTER — Other Ambulatory Visit: Payer: Self-pay

## 2021-08-20 DIAGNOSIS — R0789 Other chest pain: Secondary | ICD-10-CM | POA: Diagnosis not present

## 2021-08-20 MED ORDER — PERFLUTREN LIPID MICROSPHERE
1.0000 mL | INTRAVENOUS | Status: AC | PRN
  Administered 2021-08-20: 2 mL via INTRAVENOUS
  Administered 2021-08-20: 3 mL via INTRAVENOUS
  Administered 2021-08-20: 2 mL via INTRAVENOUS

## 2021-08-23 ENCOUNTER — Telehealth: Payer: Self-pay

## 2021-08-23 NOTE — Telephone Encounter (Signed)
-----   Message from Rajan R Revankar, MD sent at 08/21/2021  7:11 PM EST ----- °The results of the study is unremarkable. Please inform patient. I will discuss in detail at next appointment. Cc  primary care/referring physician °Rajan R Revankar, MD 08/21/2021 7:11 PM  °

## 2021-08-23 NOTE — Telephone Encounter (Signed)
Patient notified of results, results faxed to PCP.  ?

## 2021-12-18 ENCOUNTER — Inpatient Hospital Stay: Admission: RE | Admit: 2021-12-18 | Payer: 59 | Source: Ambulatory Visit

## 2022-02-26 ENCOUNTER — Ambulatory Visit: Payer: 59 | Admitting: Cardiology

## 2024-01-02 ENCOUNTER — Ambulatory Visit: Payer: Self-pay | Admitting: Urology

## 2024-01-02 ENCOUNTER — Ambulatory Visit (HOSPITAL_BASED_OUTPATIENT_CLINIC_OR_DEPARTMENT_OTHER)
Admission: RE | Admit: 2024-01-02 | Discharge: 2024-01-02 | Disposition: A | Discharge status: Home / Self Care | Source: Ambulatory Visit | Attending: Urology | Admitting: Urology

## 2024-01-02 ENCOUNTER — Encounter: Payer: Self-pay | Admitting: Urology

## 2024-01-02 VITALS — BP 148/103 | HR 83 | Ht 72.0 in | Wt 270.0 lb

## 2024-01-02 DIAGNOSIS — N401 Enlarged prostate with lower urinary tract symptoms: Secondary | ICD-10-CM

## 2024-01-02 DIAGNOSIS — N210 Calculus in bladder: Secondary | ICD-10-CM | POA: Diagnosis not present

## 2024-01-02 DIAGNOSIS — N200 Calculus of kidney: Secondary | ICD-10-CM | POA: Diagnosis not present

## 2024-01-02 DIAGNOSIS — N138 Other obstructive and reflux uropathy: Secondary | ICD-10-CM | POA: Diagnosis not present

## 2024-01-02 DIAGNOSIS — N201 Calculus of ureter: Secondary | ICD-10-CM

## 2024-01-02 LAB — URINALYSIS, ROUTINE W REFLEX MICROSCOPIC
Bilirubin, UA: NEGATIVE
Glucose, UA: NEGATIVE
Ketones, UA: NEGATIVE
Leukocytes,UA: NEGATIVE
Nitrite, UA: NEGATIVE
Protein,UA: NEGATIVE
RBC, UA: NEGATIVE
Specific Gravity, UA: 1.02 (ref 1.005–1.030)
Urobilinogen, Ur: 0.2 mg/dL (ref 0.2–1.0)
pH, UA: 7 (ref 5.0–7.5)

## 2024-01-02 LAB — BLADDER SCAN AMB NON-IMAGING: Scan Result: 30

## 2024-01-02 MED ORDER — TAMSULOSIN HCL 0.4 MG PO CAPS: 0.4000 mg | ORAL_CAPSULE | Freq: Every day | ORAL | Status: DC

## 2024-01-02 NOTE — H&P (View-Only) (Signed)
 Assessment: 1. Bladder calculi   2. Nephrolithiasis   3. Ureteral calculus   4. BPH with obstruction/lower urinary tract symptoms     Plan: I personally reviewed the patient's chart including provider notes, lab and imaging results. I personally reviewed the CT study from 12/11/2023 with results as noted below. KUB today shows calcifications in bladder area, several calcifications in lateral pelvis bilaterally ? Phleboliths vs ureteral calculi.  I discussed management of bladder calculi with laser cystolitholapaxy.  I also discussed the role of TURP in management of BPH and bladder calculi.  He is clinically doing well with medical therapy with tamsulosin.  I think would be reasonable to postpone a prostate procedure at this time.  Risk and benefits of this procedure reviewed with the patient.  Following our discussion, he elects to proceed with the operation as described. Continue tamsulosin  Procedure: The patient will be scheduled for cystoscopy, bilateral retrograde pyelograms, laser cystolitholapaxy, possible ureteroscopic laser lithotripsy, possible ureteral stent at [facility].  Surgical request is placed with the surgery schedulers and will be scheduled at the patient's/family request. Informed consent is given as documented below. Anesthesia: General  The patient does not have sleep apnea, history of MRSA, history of VRE, history of cardiac device requiring special anesthetic needs. Patient is stable and considered clear for surgical management in an outpatient ambulatory surgery setting as well as inpatient hospital setting.  Consent for Operation or Procedure: Provider Certification I hereby certify that the nature, purpose, benefits, usual and most frequent risks of, and alternatives to, the operation or procedure have been explained to the patient (or person authorized to sign for the patient) either by me as responsible physician or by the provider who is to perform the  operation or procedure. Time spent such that the patient/family has had an opportunity to ask questions, and that those questions have been answered. The patient or the patient's representative has been advised that selected tasks may be performed by assistants to the primary health care provider(s). I believe that the patient (or person authorized to sign for the patient) understands what has been explained, and has consented to the operation or procedure. No guarantees were implied or made.   Chief Complaint:  Chief Complaint  Patient presents with   Nephrolithiasis    History of Present Illness:  Gerald Conner is a 68 y.o. male who is seen for evaluation of nephrolithiasis, BPH with LUTS, bladder calculi, and ureteral calculi.  He has a history of nephrolithiasis, elevated PSA, and bladder calculi.  He has had nephrolithiasis since 2007 with 20+ stone episodes.  He has required ureteroscopy x 2.  He was started on potassiums citrate in February 2024.  He has been followed by Dr. Domingo Friend with [facility] and was last seen in March 2025. CT imaging from 2/25 showed multiple left renal stones up to 8 mm in size and three 1 cm bladder stones.  He was initially scheduled for cystolitholapaxy with TURP.  However, his urinary symptoms improved with the addition of Flomax and the plan was for cystolitholapaxy.  He was seen in the emergency room on 12/11/2023 with left flank pain.  CT imaging showed moderate left hydronephrosis with a 7 mm distal left ureteral stone and an additional 3 mm left UVJ stone as well as multiple bladder stones. He reportedly passed the stones while in the emergency room.  He has not had any additional left flank pain for several weeks. He was scheduled for surgery last  Friday.  He apparently elected to cancel the surgery due to operating room delays.  PSA from 11/20/2023: 1.38  He continues to have intermittent lower urinary tract symptoms with urgency, weak stream,  frequency, sensation of incomplete emptying, and intermittent stream.  No dysuria or gross hematuria.  No recent UTIs.  He continues on tamsulosin 0.4 mg daily. IPSS = 16/4.  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 06/06/2019   RBBB 10/13/2015    Past Surgical History:  Past Surgical History:  Procedure Laterality Date   WRIST ARTHROCENTESIS      Allergies:  Allergies  Allergen Reactions   Shellfish Allergy Anaphylaxis    tilapia    Family History:  Family History  Problem Relation Age of Onset   Diabetes Mother    Heart disease Mother    Diabetes Sister    Diabetes Brother     Social History:  Social History   Tobacco Use   Smoking status: Never   Smokeless tobacco: Never  Vaping Use   Vaping status: Never Used  Substance Use Topics   Alcohol use: Not Currently   Drug use: Not Currently    Review of symptoms:  Constitutional:  Negative for unexplained weight loss, night sweats, fever, chills ENT:  Negative for nose bleeds, sinus pain, painful swallowing CV:  Negative for chest pain, shortness of breath, exercise intolerance, palpitations, loss of consciousness Resp:  Negative for cough, wheezing, shortness of breath GI:  Negative for nausea, vomiting, diarrhea, bloody stools GU:  Positives noted in HPI; otherwise negative for gross hematuria, dysuria, urinary incontinence Neuro:  Negative for seizures, poor balance, limb weakness, slurred speech Psych:  Negative for lack of energy, depression,  anxiety Endocrine:  Negative for polydipsia, polyuria, symptoms of hypoglycemia (dizziness, hunger, sweating) Hematologic:  Negative for anemia, purpura, petechia, prolonged or excessive bleeding, use of anticoagulants  Allergic:  Negative for difficulty breathing or choking as a result of exposure to anything; no shellfish allergy; no allergic response (rash/itch) to materials, foods  Physical exam: BP (!) 148/103   Pulse 83   Ht 6' (1.829 m)   Wt 270 lb (122.5 kg)   BMI 36.62 kg/m  GENERAL APPEARANCE:  Well appearing, well developed, well nourished, NAD HEENT: Atraumatic, Normocephalic, oropharynx clear. NECK: Supple without lymphadenopathy or thyromegaly. LUNGS: Clear to auscultation bilaterally. HEART: Regular Rate and Rhythm without murmurs, gallops, or rubs. ABDOMEN: Soft, non-tender, No Masses. EXTREMITIES: Moves all extremities well.  Without clubbing, cyanosis, or edema. NEUROLOGIC:  Alert and oriented x 3, normal gait, CN II-XII grossly intact.  MENTAL STATUS:  Appropriate. BACK:  Non-tender to palpation.  No CVAT SKIN:  Warm, dry and intact.    Results: U/A:  Negative  PVR = 30 ml

## 2024-01-02 NOTE — Progress Notes (Signed)
 Assessment: 1. Bladder calculi   2. Nephrolithiasis   3. Ureteral calculus   4. BPH with obstruction/lower urinary tract symptoms     Plan: I personally reviewed the patient's chart including provider notes, lab and imaging results. I personally reviewed the CT study from 12/11/2023 with results as noted below. KUB today shows calcifications in bladder area, several calcifications in lateral pelvis bilaterally ? Phleboliths vs ureteral calculi.  I discussed management of bladder calculi with laser cystolitholapaxy.  I also discussed the role of TURP in management of BPH and bladder calculi.  He is clinically doing well with medical therapy with tamsulosin.  I think would be reasonable to postpone a prostate procedure at this time.  Risk and benefits of this procedure reviewed with the patient.  Following our discussion, he elects to proceed with the operation as described. Continue tamsulosin  Procedure: The patient will be scheduled for cystoscopy, bilateral retrograde pyelograms, laser cystolitholapaxy, possible ureteroscopic laser lithotripsy, possible ureteral stent at [facility].  Surgical request is placed with the surgery schedulers and will be scheduled at the patient's/family request. Informed consent is given as documented below. Anesthesia: General  The patient does not have sleep apnea, history of MRSA, history of VRE, history of cardiac device requiring special anesthetic needs. Patient is stable and considered clear for surgical management in an outpatient ambulatory surgery setting as well as inpatient hospital setting.  Consent for Operation or Procedure: Provider Certification I hereby certify that the nature, purpose, benefits, usual and most frequent risks of, and alternatives to, the operation or procedure have been explained to the patient (or person authorized to sign for the patient) either by me as responsible physician or by the provider who is to perform the  operation or procedure. Time spent such that the patient/family has had an opportunity to ask questions, and that those questions have been answered. The patient or the patient's representative has been advised that selected tasks may be performed by assistants to the primary health care provider(s). I believe that the patient (or person authorized to sign for the patient) understands what has been explained, and has consented to the operation or procedure. No guarantees were implied or made.   Chief Complaint:  Chief Complaint  Patient presents with   Nephrolithiasis    History of Present Illness:  Gerald Conner is a 68 y.o. male who is seen for evaluation of nephrolithiasis, BPH with LUTS, bladder calculi, and ureteral calculi.  He has a history of nephrolithiasis, elevated PSA, and bladder calculi.  He has had nephrolithiasis since 2007 with 20+ stone episodes.  He has required ureteroscopy x 2.  He was started on potassiums citrate in February 2024.  He has been followed by Dr. Domingo Friend with [facility] and was last seen in March 2025. CT imaging from 2/25 showed multiple left renal stones up to 8 mm in size and three 1 cm bladder stones.  He was initially scheduled for cystolitholapaxy with TURP.  However, his urinary symptoms improved with the addition of Flomax and the plan was for cystolitholapaxy.  He was seen in the emergency room on 12/11/2023 with left flank pain.  CT imaging showed moderate left hydronephrosis with a 7 mm distal left ureteral stone and an additional 3 mm left UVJ stone as well as multiple bladder stones. He reportedly passed the stones while in the emergency room.  He has not had any additional left flank pain for several weeks. He was scheduled for surgery last  Friday.  He apparently elected to cancel the surgery due to operating room delays.  PSA from 11/20/2023: 1.38  He continues to have intermittent lower urinary tract symptoms with urgency, weak stream,  frequency, sensation of incomplete emptying, and intermittent stream.  No dysuria or gross hematuria.  No recent UTIs.  He continues on tamsulosin 0.4 mg daily. IPSS = 16/4.  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 06/06/2019   RBBB 10/13/2015    Past Surgical History:  Past Surgical History:  Procedure Laterality Date   WRIST ARTHROCENTESIS      Allergies:  Allergies  Allergen Reactions   Shellfish Allergy Anaphylaxis    tilapia    Family History:  Family History  Problem Relation Age of Onset   Diabetes Mother    Heart disease Mother    Diabetes Sister    Diabetes Brother     Social History:  Social History   Tobacco Use   Smoking status: Never   Smokeless tobacco: Never  Vaping Use   Vaping status: Never Used  Substance Use Topics   Alcohol use: Not Currently   Drug use: Not Currently    Review of symptoms:  Constitutional:  Negative for unexplained weight loss, night sweats, fever, chills ENT:  Negative for nose bleeds, sinus pain, painful swallowing CV:  Negative for chest pain, shortness of breath, exercise intolerance, palpitations, loss of consciousness Resp:  Negative for cough, wheezing, shortness of breath GI:  Negative for nausea, vomiting, diarrhea, bloody stools GU:  Positives noted in HPI; otherwise negative for gross hematuria, dysuria, urinary incontinence Neuro:  Negative for seizures, poor balance, limb weakness, slurred speech Psych:  Negative for lack of energy, depression,  anxiety Endocrine:  Negative for polydipsia, polyuria, symptoms of hypoglycemia (dizziness, hunger, sweating) Hematologic:  Negative for anemia, purpura, petechia, prolonged or excessive bleeding, use of anticoagulants  Allergic:  Negative for difficulty breathing or choking as a result of exposure to anything; no shellfish allergy; no allergic response (rash/itch) to materials, foods  Physical exam: BP (!) 148/103   Pulse 83   Ht 6' (1.829 m)   Wt 270 lb (122.5 kg)   BMI 36.62 kg/m  GENERAL APPEARANCE:  Well appearing, well developed, well nourished, NAD HEENT: Atraumatic, Normocephalic, oropharynx clear. NECK: Supple without lymphadenopathy or thyromegaly. LUNGS: Clear to auscultation bilaterally. HEART: Regular Rate and Rhythm without murmurs, gallops, or rubs. ABDOMEN: Soft, non-tender, No Masses. EXTREMITIES: Moves all extremities well.  Without clubbing, cyanosis, or edema. NEUROLOGIC:  Alert and oriented x 3, normal gait, CN II-XII grossly intact.  MENTAL STATUS:  Appropriate. BACK:  Non-tender to palpation.  No CVAT SKIN:  Warm, dry and intact.    Results: U/A:  Negative  PVR = 30 ml

## 2024-01-08 ENCOUNTER — Ambulatory Visit: Payer: Self-pay | Admitting: Urology

## 2024-01-08 DIAGNOSIS — N201 Calculus of ureter: Secondary | ICD-10-CM

## 2024-01-08 DIAGNOSIS — N210 Calculus in bladder: Secondary | ICD-10-CM

## 2024-01-16 NOTE — Patient Instructions (Signed)
 SURGICAL WAITING ROOM VISITATION  Patients having surgery or a procedure may have no more than 2 support people in the waiting area - these visitors may rotate.    Children under the age of 76 must have an adult with them who is not the patient.  Visitors with respiratory illnesses are discouraged from visiting and should remain at home.  If the patient needs to stay at the hospital during part of their recovery, the visitor guidelines for inpatient rooms apply. Pre-op nurse will coordinate an appropriate time for 1 support person to accompany patient in pre-op.  This support person may not rotate.    Please refer to the [facility] website for the visitor guidelines for Inpatients (after your surgery is over and you are in a regular room).       Your procedure is scheduled on:  01/27/2024    Report to [facility] Main Entrance    Report to admitting at  200 pm  AM   Call this number if you have problems the morning of surgery [phone]   Do not eat food  :After Midnight. May have liquids from 12 midnite until 100pm day fo surgery.           Water       Apple juice, white grape juice, white cranberry  juice         Coffee and tea with no milk cream or creamer, sugar and sweetener is ok          Jello, fruit ices, popsicles, gatorade- no red              Oral Hygiene is also important to reduce your risk of infection.                                    Remember - BRUSH YOUR TEETH THE MORNING OF SURGERY WITH YOUR REGULAR TOOTHPASTE  DENTURES WILL BE REMOVED PRIOR TO SURGERY PLEASE DO NOT APPLY "Poly grip" OR ADHESIVES!!!   Do NOT smoke after Midnight   Stop all vitamins and herbal supplements 7 days before surgery.   Take these medicines the morning of surgery with A SIP OF WATER:  amlodipine , proscar, trileptal, flomax    DO NOT TAKE ANY ORAL DIABETIC MEDICATIONS DAY OF YOUR SURGERY  Bring CPAP mask and tubing day of surgery.                               You may not have any metal on your body including hair pins, jewelry, and body piercing             Do not wear make-up, lotions, powders, perfumes/cologne, or deodorant  Do not wear nail polish including gel and S&S, artificial/acrylic nails, or any other type of covering on natural nails including finger and toenails. If you have artificial nails, gel coating, etc. that needs to be removed by a nail salon please have this removed prior to surgery or surgery may need to be canceled/ delayed if the surgeon/ anesthesia feels like they are unable to be safely monitored.   Do not shave  48 hours prior to surgery.               Men may shave face and neck.   Do not bring valuables to the hospital. [place] IS NOT  RESPONSIBLE   FOR VALUABLES.   Contacts, glasses, dentures or bridgework may not be worn into surgery.   Bring small overnight bag day of surgery.   DO NOT BRING YOUR HOME MEDICATIONS TO THE HOSPITAL. PHARMACY WILL DISPENSE MEDICATIONS LISTED ON YOUR MEDICATION LIST TO YOU DURING YOUR ADMISSION IN THE HOSPITAL!    Patients discharged on the day of surgery will not be allowed to drive home.  Someone NEEDS to stay with you for the first 24 hours after anesthesia.   Special Instructions: Bring a copy of your healthcare power of attorney and living will documents the day of surgery if you haven't scanned them before.              Please read over the following fact sheets you were given: IF YOU HAVE QUESTIONS ABOUT YOUR PRE-OP INSTRUCTIONS PLEASE CALL [phone]   If you received a COVID test during your pre-op visit  it is requested that you wear a mask when out in public, stay away from anyone that may not be feeling well and notify your surgeon if you develop symptoms. If you test positive for Covid or have been in contact with anyone that has tested positive in the last 10 days please notify you surgeon.    [facility] - Preparing for Surgery Before surgery, you  can play an important role.  Because skin is not sterile, your skin needs to be as free of germs as possible.  You can reduce the number of germs on your skin by washing with CHG (chlorahexidine gluconate) soap before surgery.  CHG is an antiseptic cleaner which kills germs and bonds with the skin to continue killing germs even after washing. Please DO NOT use if you have an allergy to CHG or antibacterial soaps.  If your skin becomes reddened/irritated stop using the CHG and inform your nurse when you arrive at Short Stay. Do not shave (including legs and underarms) for at least 48 hours prior to the first CHG shower.  You may shave your face/neck. Please follow these instructions carefully:  1.  Shower with CHG Soap the night before surgery and the  morning of Surgery.  2.  If you choose to wash your hair, wash your hair first as usual with your  normal  shampoo.  3.  After you shampoo, rinse your hair and body thoroughly to remove the  shampoo.                           4.  Use CHG as you would any other liquid soap.  You can apply chg directly  to the skin and wash                       Gently with a scrungie or clean washcloth.  5.  Apply the CHG Soap to your body ONLY FROM THE NECK DOWN.   Do not use on face/ open                           Wound or open sores. Avoid contact with eyes, ears mouth and genitals (private parts).                       Wash face,  Genitals (private parts) with your normal soap.             6.  Wash thoroughly,  paying special attention to the area where your surgery  will be performed.  7.  Thoroughly rinse your body with warm water from the neck down.  8.  DO NOT shower/wash with your normal soap after using and rinsing off  the CHG Soap.                9.  Pat yourself dry with a clean towel.            10.  Wear clean pajamas.            11.  Place clean sheets on your bed the night of your first shower and do not  sleep with pets. Day of Surgery : Do not apply  any lotions/deodorants the morning of surgery.  Please wear clean clothes to the hospital/surgery center.  FAILURE TO FOLLOW THESE INSTRUCTIONS MAY RESULT IN THE CANCELLATION OF YOUR SURGERY PATIENT SIGNATURE_________________________________  NURSE SIGNATURE__________________________________  ________________________________________________________________________

## 2024-01-16 NOTE — Progress Notes (Signed)
 Anesthesia Review:  PCP: Cardiologist :  PPM/ ICD: Device Orders: Rep Notified:  Chest x-ray : EKG : 12/11/23- [facility]  Echo : Stress test: 2022  Cardiac Cath :   Activity level:  Sleep Study/ CPAP : Fasting Blood Sugar :      / Checks Blood Sugar -- times a day:    Blood Thinner/ Instructions /Last Dose: ASA / Instructions/ Last Dose :    12/11/23- Admitted with stone

## 2024-01-20 ENCOUNTER — Other Ambulatory Visit: Payer: Self-pay

## 2024-01-20 ENCOUNTER — Encounter (HOSPITAL_COMMUNITY)
Admission: RE | Admit: 2024-01-20 | Discharge: 2024-01-20 | Disposition: A | Discharge status: Home / Self Care | Source: Ambulatory Visit | Attending: Urology | Admitting: Urology

## 2024-01-20 ENCOUNTER — Encounter (HOSPITAL_COMMUNITY): Payer: Self-pay

## 2024-01-20 VITALS — BP 137/100 | HR 80 | Temp 97.9°F | Resp 16 | Ht 72.0 in | Wt 268.0 lb

## 2024-01-20 DIAGNOSIS — Z01812 Encounter for preprocedural laboratory examination: Secondary | ICD-10-CM | POA: Diagnosis present

## 2024-01-20 DIAGNOSIS — Z01818 Encounter for other preprocedural examination: Secondary | ICD-10-CM

## 2024-01-20 LAB — BASIC METABOLIC PANEL WITH GFR
Anion gap: 8 (ref 5–15)
BUN: 21 mg/dL (ref 8–23)
CO2: 23 mmol/L (ref 22–32)
Calcium: 8.9 mg/dL (ref 8.9–10.3)
Chloride: 106 mmol/L (ref 98–111)
Creatinine, Ser: 1.3 mg/dL — ABNORMAL HIGH (ref 0.61–1.24)
GFR, Estimated: >60 mL/min (ref >60)
Glucose, Bld: 100 mg/dL — ABNORMAL HIGH (ref 70–99)
Potassium: 4.4 mmol/L (ref 3.5–5.1)
Sodium: 137 mmol/L (ref 135–145)

## 2024-01-20 LAB — CBC
HCT: 48.6 % (ref 39.0–52.0)
Hemoglobin: 16.1 g/dL (ref 13.0–17.0)
MCH: 32.5 pg (ref 26.0–34.0)
MCHC: 33.1 g/dL (ref 30.0–36.0)
MCV: 98.2 fL (ref 80.0–100.0)
Platelets: 199 10*3/uL (ref 150–400)
RBC: 4.95 MIL/uL (ref 4.22–5.81)
RDW: 12.6 % (ref 11.5–15.5)
WBC: 5.3 10*3/uL (ref 4.0–10.5)
nRBC: 0 % (ref 0.0–0.2)

## 2024-01-21 ENCOUNTER — Encounter (HOSPITAL_COMMUNITY): Payer: Self-pay

## 2024-01-21 NOTE — Anesthesia Preprocedure Evaluation (Addendum)
 Anesthesia Evaluation  Patient identified by MRN, date of birth, ID band Patient awake    Reviewed: Allergy & Precautions, H&P , NPO status , Patient's Chart, lab work & pertinent test results  Airway Mallampati: III  TM Distance: >3 FB Neck ROM: Full    Dental no notable dental hx. (+) Teeth Intact, Dental Advisory Given   Pulmonary neg pulmonary ROS   Pulmonary exam normal breath sounds clear to auscultation       Cardiovascular hypertension, Pt. on medications  Rhythm:Regular Rate:Normal     Neuro/Psych Seizures -, Well Controlled,   negative psych ROS   GI/Hepatic Neg liver ROS,GERD  ,,  Endo/Other    Class 3 obesity  Renal/GU Renal disease  negative genitourinary   Musculoskeletal   Abdominal   Peds  Hematology negative hematology ROS (+)   Anesthesia Other Findings   Reproductive/Obstetrics negative OB ROS                             Anesthesia Physical Anesthesia Plan  ASA: 3  Anesthesia Plan: General   Post-op Pain Management: Ofirmev IV (intra-op)*   Induction: Intravenous  PONV Risk Score and Plan: 3 and Ondansetron, Dexamethasone and Midazolam  Airway Management Planned: LMA  Additional Equipment:   Intra-op Plan:   Post-operative Plan: Extubation in OR  Informed Consent: I have reviewed the patients History and Physical, chart, labs and discussed the procedure including the risks, benefits and alternatives for the proposed anesthesia with the patient or authorized representative who has indicated his/her understanding and acceptance.     Dental advisory given  Plan Discussed with: CRNA  Anesthesia Plan Comments: (PMH of PONV, HTN, RBBB, epilepsy, GERD, hx of kidney stones. Mild AKI on PAT labs otherwise WNL)        Anesthesia Quick Evaluation

## 2024-01-27 ENCOUNTER — Ambulatory Visit (HOSPITAL_COMMUNITY): Payer: Self-pay | Admitting: Medical

## 2024-01-27 ENCOUNTER — Ambulatory Visit (HOSPITAL_COMMUNITY)

## 2024-01-27 ENCOUNTER — Encounter (HOSPITAL_COMMUNITY): Payer: Self-pay | Admitting: Urology

## 2024-01-27 ENCOUNTER — Other Ambulatory Visit: Payer: Self-pay

## 2024-01-27 ENCOUNTER — Encounter (HOSPITAL_COMMUNITY)
Admission: RE | Disposition: A | Discharge status: Home / Self Care | Payer: Self-pay | Source: Home / Self Care | Attending: Urology

## 2024-01-27 ENCOUNTER — Ambulatory Visit (HOSPITAL_BASED_OUTPATIENT_CLINIC_OR_DEPARTMENT_OTHER): Admitting: Anesthesiology

## 2024-01-27 ENCOUNTER — Ambulatory Visit (HOSPITAL_COMMUNITY)
Admission: RE | Admit: 2024-01-27 | Discharge: 2024-01-27 | Disposition: A | Discharge status: Home / Self Care | Attending: Urology | Admitting: Urology

## 2024-01-27 DIAGNOSIS — Z87442 Personal history of urinary calculi: Secondary | ICD-10-CM | POA: Diagnosis not present

## 2024-01-27 DIAGNOSIS — K219 Gastro-esophageal reflux disease without esophagitis: Secondary | ICD-10-CM | POA: Diagnosis not present

## 2024-01-27 DIAGNOSIS — N201 Calculus of ureter: Secondary | ICD-10-CM | POA: Diagnosis present

## 2024-01-27 DIAGNOSIS — E66813 Obesity, class 3: Secondary | ICD-10-CM | POA: Insufficient documentation

## 2024-01-27 DIAGNOSIS — Z6837 Body mass index (BMI) 37.0-37.9, adult: Secondary | ICD-10-CM | POA: Diagnosis not present

## 2024-01-27 DIAGNOSIS — R3912 Poor urinary stream: Secondary | ICD-10-CM | POA: Insufficient documentation

## 2024-01-27 DIAGNOSIS — I10 Essential (primary) hypertension: Secondary | ICD-10-CM

## 2024-01-27 DIAGNOSIS — N401 Enlarged prostate with lower urinary tract symptoms: Secondary | ICD-10-CM | POA: Insufficient documentation

## 2024-01-27 DIAGNOSIS — R3915 Urgency of urination: Secondary | ICD-10-CM | POA: Insufficient documentation

## 2024-01-27 DIAGNOSIS — N210 Calculus in bladder: Secondary | ICD-10-CM

## 2024-01-27 DIAGNOSIS — R39198 Other difficulties with micturition: Secondary | ICD-10-CM | POA: Diagnosis not present

## 2024-01-27 DIAGNOSIS — R339 Retention of urine, unspecified: Secondary | ICD-10-CM | POA: Insufficient documentation

## 2024-01-27 DIAGNOSIS — Z8249 Family history of ischemic heart disease and other diseases of the circulatory system: Secondary | ICD-10-CM | POA: Insufficient documentation

## 2024-01-27 DIAGNOSIS — N138 Other obstructive and reflux uropathy: Secondary | ICD-10-CM

## 2024-01-27 DIAGNOSIS — R350 Frequency of micturition: Secondary | ICD-10-CM | POA: Insufficient documentation

## 2024-01-27 DIAGNOSIS — Z01818 Encounter for other preprocedural examination: Secondary | ICD-10-CM

## 2024-01-27 HISTORY — PX: CYSTOSCOPY/URETEROSCOPY/HOLMIUM LASER/STENT PLACEMENT: SHX6546

## 2024-01-27 SURGERY — CYSTOSCOPY/URETEROSCOPY/HOLMIUM LASER/STENT PLACEMENT
Anesthesia: General | Laterality: Bilateral

## 2024-01-27 MED ORDER — ONDANSETRON HCL 4 MG/2ML IJ SOLN
INTRAMUSCULAR | Status: DC | PRN
  Administered 2024-01-27: 4 mg via INTRAVENOUS

## 2024-01-27 MED ORDER — LABETALOL HCL 5 MG/ML IV SOLN
INTRAVENOUS | Status: AC
  Filled 2024-01-27: qty 4

## 2024-01-27 MED ORDER — PROPOFOL 10 MG/ML IV BOLUS
INTRAVENOUS | Status: DC | PRN
  Administered 2024-01-27: 200 mg via INTRAVENOUS

## 2024-01-27 MED ORDER — ORAL CARE MOUTH RINSE: 15.0000 mL | Freq: Once | OROMUCOSAL | Status: AC

## 2024-01-27 MED ORDER — LIDOCAINE HCL (CARDIAC) PF 100 MG/5ML IV SOSY
PREFILLED_SYRINGE | INTRAVENOUS | Status: DC | PRN
  Administered 2024-01-27: 880 mg via INTRAVENOUS

## 2024-01-27 MED ORDER — 0.9 % SODIUM CHLORIDE (POUR BTL) OPTIME
TOPICAL | Status: DC | PRN
  Administered 2024-01-27 (×2): 1000 mL

## 2024-01-27 MED ORDER — ONDANSETRON HCL 4 MG/2ML IJ SOLN
INTRAMUSCULAR | Status: AC
  Filled 2024-01-27: qty 2

## 2024-01-27 MED ORDER — HYDRALAZINE HCL 20 MG/ML IJ SOLN
INTRAMUSCULAR | Status: AC
  Filled 2024-01-27: qty 1

## 2024-01-27 MED ORDER — DEXAMETHASONE SODIUM PHOSPHATE 10 MG/ML IJ SOLN
INTRAMUSCULAR | Status: DC | PRN
  Administered 2024-01-27: 5 mg via INTRAVENOUS

## 2024-01-27 MED ORDER — PROPOFOL 10 MG/ML IV BOLUS
INTRAVENOUS | Status: AC
  Filled 2024-01-27: qty 20

## 2024-01-27 MED ORDER — CHLORHEXIDINE GLUCONATE 0.12 % MT SOLN
15.0000 mL | Freq: Once | OROMUCOSAL | Status: AC
  Administered 2024-01-27: 15 mL via OROMUCOSAL

## 2024-01-27 MED ORDER — LACTATED RINGERS IV SOLN: INTRAVENOUS | Status: DC | PRN

## 2024-01-27 MED ORDER — FENTANYL CITRATE (PF) 100 MCG/2ML IJ SOLN
INTRAMUSCULAR | Status: AC
  Filled 2024-01-27: qty 2

## 2024-01-27 MED ORDER — SULFAMETHOXAZOLE-TRIMETHOPRIM 800-160 MG PO TABS: 1.0000 | ORAL_TABLET | Freq: Two times a day (BID) | ORAL | 0 refills | Status: AC

## 2024-01-27 MED ORDER — IOHEXOL 300 MG/ML  SOLN
INTRAMUSCULAR | Status: DC | PRN
  Administered 2024-01-27: 50 mL

## 2024-01-27 MED ORDER — HYDRALAZINE HCL 20 MG/ML IJ SOLN
5.0000 mg | Freq: Once | INTRAMUSCULAR | Status: AC
  Administered 2024-01-27: 5 mg via INTRAVENOUS

## 2024-01-27 MED ORDER — LIDOCAINE HCL (PF) 2 % IJ SOLN
INTRAMUSCULAR | Status: AC
  Filled 2024-01-27: qty 5

## 2024-01-27 MED ORDER — LABETALOL HCL 5 MG/ML IV SOLN
5.0000 mg | INTRAVENOUS | Status: AC | PRN
  Administered 2024-01-27 (×4): 5 mg via INTRAVENOUS

## 2024-01-27 MED ORDER — OXYCODONE HCL 5 MG PO TABS
5.0000 mg | ORAL_TABLET | Freq: Once | ORAL | Status: AC
  Administered 2024-01-27: 5 mg via ORAL

## 2024-01-27 MED ORDER — DEXAMETHASONE SODIUM PHOSPHATE 10 MG/ML IJ SOLN
INTRAMUSCULAR | Status: AC
  Filled 2024-01-27: qty 1

## 2024-01-27 MED ORDER — MIDAZOLAM HCL 5 MG/5ML IJ SOLN
INTRAMUSCULAR | Status: DC | PRN
  Administered 2024-01-27: 2 mg via INTRAVENOUS

## 2024-01-27 MED ORDER — SODIUM CHLORIDE 0.9 % IR SOLN
Status: DC | PRN
  Administered 2024-01-27 (×2): 3000 mL via INTRAVESICAL

## 2024-01-27 MED ORDER — OXYCODONE HCL 5 MG PO TABS
ORAL_TABLET | ORAL | Status: AC
  Filled 2024-01-27: qty 1

## 2024-01-27 MED ORDER — FENTANYL CITRATE (PF) 100 MCG/2ML IJ SOLN
INTRAMUSCULAR | Status: DC | PRN
  Administered 2024-01-27 (×4): 50 ug via INTRAVENOUS

## 2024-01-27 MED ORDER — FENTANYL CITRATE PF 50 MCG/ML IJ SOSY
PREFILLED_SYRINGE | INTRAMUSCULAR | Status: AC
  Filled 2024-01-27: qty 3

## 2024-01-27 MED ORDER — CEFAZOLIN SODIUM-DEXTROSE 3-4 GM/150ML-% IV SOLN
3.0000 g | INTRAVENOUS | Status: AC
  Administered 2024-01-27: 3 g via INTRAVENOUS
  Filled 2024-01-27: qty 150

## 2024-01-27 MED ORDER — PHENAZOPYRIDINE HCL 200 MG PO TABS: 200.0000 mg | ORAL_TABLET | Freq: Three times a day (TID) | ORAL | 0 refills | Status: DC | PRN

## 2024-01-27 MED ORDER — MIDAZOLAM HCL 2 MG/2ML IJ SOLN
INTRAMUSCULAR | Status: AC
  Filled 2024-01-27: qty 2

## 2024-01-27 MED ORDER — FENTANYL CITRATE PF 50 MCG/ML IJ SOSY
25.0000 ug | PREFILLED_SYRINGE | INTRAMUSCULAR | Status: DC | PRN
  Administered 2024-01-27 (×3): 50 ug via INTRAVENOUS

## 2024-01-27 MED ORDER — LACTATED RINGERS IV SOLN: INTRAVENOUS | Status: DC

## 2024-01-27 SURGICAL SUPPLY — 20 items
BAG URINE DRAIN 2000ML AR STRL (UROLOGICAL SUPPLIES) IMPLANT
BAG URO CATCHER STRL LF (MISCELLANEOUS) ×1 IMPLANT
BASKET ZERO TIP NITINOL 2.4FR (BASKET) IMPLANT
CATH TIEMANN FOLEY 18FR 5CC (CATHETERS) IMPLANT
CATH URETL OPEN 5X70 (CATHETERS) IMPLANT
CLOTH BEACON ORANGE TIMEOUT ST (SAFETY) ×1 IMPLANT
EVACUATOR MICROVAS BLADDER (UROLOGICAL SUPPLIES) IMPLANT
GLOVE SURG LX STRL 8.0 MICRO (GLOVE) ×1 IMPLANT
GOWN STRL REUS W/ TWL XL LVL3 (GOWN DISPOSABLE) ×1 IMPLANT
GUIDEWIRE STR DUAL SENSOR (WIRE) ×1 IMPLANT
GUIDEWIRE ZIPWRE .038 STRAIGHT (WIRE) IMPLANT
KIT TURNOVER KIT A (KITS) IMPLANT
MANIFOLD NEPTUNE II (INSTRUMENTS) ×1 IMPLANT
PACK CYSTO (CUSTOM PROCEDURE TRAY) ×1 IMPLANT
SHEATH NAVIGATOR HD 11/13X36 (SHEATH) IMPLANT
SHEATH NAVIGATOR HD 12/14X36 (SHEATH) IMPLANT
SHEATH URETERAL FLEX 12X35 (SHEATH) IMPLANT
TRACTIP FLEXIVA PULS ID 200XHI (Laser) IMPLANT
TUBING CONNECTING 10 (TUBING) ×1 IMPLANT
TUBING UROLOGY SET (TUBING) ×1 IMPLANT

## 2024-01-27 NOTE — Op Note (Signed)
 OPERATIVE NOTE   Patient Name: Gerald Conner  MRN: [id number]   Date of Procedure: 01/27/24    Preoperative diagnosis:  Bladder calculi Left ureteral calculus BPH with LUTS  Postoperative diagnosis:  Bladder calculi BPH with LUTS  Procedure:  Cystoscopy Bilateral retrograde pyelograms with intraoperative interpretation Laser cystolitholapaxy  Attending: Mellie Sprinkle, MD  Anesthesia: General  Estimated blood loss: 5 ml  Fluids: Per anesthesia record  Drains: 59F foley  Specimens: Bladder stone fragment given to patient  Antibiotics: Ancef 2 gm IV   Findings:  - Lateral lobe enlargement of the prostate - 3 bladder calculi approximately 1 cm in size - No left ureteral calculi  Indications:  68 year old male with history of nephrolithiasis and BPH presents for surgical management of bladder calculi and left ureteral calculi.  CT imaging from 4/25 showed moderate left hydronephrosis with a 7 mm distal left ureteral stone and additional 3 mm left UVJ stone as well as multiple bladder stones measuring approximately 1 cm in size.  He reports passing several stones while in the emergency room in April and another stone recently.  He is not having any left-sided flank pain at the present time.  He presents now for surgical management with cystoscopy, bilateral retrograde pyelograms, left ureteroscopy with possible laser lithotripsy, possible left ureteral stent insertion, and laser cystolitholopaxy.  The procedure including potential risk were discussed with the patient in detail.  He understands and wishes to proceed as described.  Description of Procedure:  The patient was taken to the operating room suite and properly identified.  He received IV Ancef preoperatively.  After successful induction of a general anesthetic, he was placed in the dorsolithotomy position.  The patient's genital area was prepped and draped in sterile fashion.  A preoperative timeout was  performed.  Under direct visualization, a 21 French rigid cystoscope was passed through the urethra and into the bladder.  The patient was noted to have lateral lobe enlargement of the prostate with some elevation of the bladder neck.  Upon entering the bladder three 1 cm stones were identified.  There was some mild mucosal edema noted at the trigone likely secondary to the stones.  A normal-appearing trigone was seen otherwise with a single orifice bilaterally.  Inspection of the bladder demonstrated no mucosal lesions.  Scout film demonstrated a nonspecific bowel gas pattern, no bony abnormalities, and no obvious calcifications.  Bilateral retrograde pyelograms were performed for evaluation of the patient's upper tracts given his history of ureteral calculi.  Using a 5 [place] open-ended catheter, the right ureteral orifice was catheterized.  Injection of contrast demonstrated a normal right ureter and collecting system without filling defect or obstruction.  In a similar fashion, contrast was injected into the left ureter.  No obvious filling defect was seen and a normal collecting system was noted.  A sensor guidewire was passed into the left ureter.  I elected to proceed with left ureteroscopy given the patient's history of left ureteral calculi seen on his CT scan and uncertainty about passage.  A semirigid ureteroscope was passed alongside the guidewire without difficulty.  The ureter was inspected up to the mid ureter and no stone was seen.  The ureteroscope was removed and there was no evidence of any ureteral injury.  The guidewire was removed.  Attention was then turned to the bladder calculi.  The cystoscope was replaced.  Using the holmium laser fiber, the bladder stones were fragmented completely.  Care was taken to avoid any injury to  the bladder during fragmentation.  The stone volume in total was >3 cm.  Using the [facility] evacuator, the stone fragments were irrigated out of the bladder.  Several  larger fragments were removed with grasping forceps.  Inspection of the bladder demonstrated no remaining stone fragments.  There was no evidence of any bladder injury.  Efflux of urine was seen from both ureters.  There was minimal bleeding from some mucosal trauma associated with stone fragmentation.  A 18 French coud catheter was placed.  The catheter irrigated easily with return of slightly bloody urine.  The patient was then extubated and taken to the postanesthesia care unit in stable condition.  Complications: None  Condition: Stable, extubated, transferred to PACU  Plan:  Discontinue catheter in PACU if urine remains clear. Discharge to home

## 2024-01-27 NOTE — Transfer of Care (Signed)
 Immediate Anesthesia Transfer of Care Note  Patient: Gerald Conner  Procedure(s) Performed: CYSTOSCOPY; BILATERAL RETROGRADE PYELOGRAMS; LEFT URETEROSCOPY; LASER CYSTOLITHOLAPAXY (Bilateral)  Patient Location: PACU  Anesthesia Type:General  Level of Consciousness: awake  Airway & Oxygen Therapy: Patient Spontanous Breathing and Patient connected to face mask oxygen  Post-op Assessment: Report given to RN and Post -op Vital signs reviewed and stable  Post vital signs: Reviewed and stable  Last Vitals:  Vitals Value Taken Time  BP 137/98 01/27/24 1508  Temp    Pulse 60 01/27/24 1510  Resp 11 01/27/24 1510  SpO2 99 % 01/27/24 1510  Vitals shown include unfiled device data.  Last Pain:  Vitals:   01/27/24 1136  TempSrc:   PainSc: 0-No pain         Complications: No notable events documented.

## 2024-01-27 NOTE — Anesthesia Procedure Notes (Signed)
 Procedure Name: LMA Insertion Date/Time: 01/27/2024 1:51 PM  Performed by: Mishael Krysiak, CRNAPre-anesthesia Checklist: Patient identified, Emergency Drugs available, Suction available and Patient being monitored Patient Re-evaluated:Patient Re-evaluated prior to induction Oxygen Delivery Method: Circle system utilized Preoxygenation: Pre-oxygenation with 100% oxygen Induction Type: IV induction Ventilation: Mask ventilation without difficulty LMA: LMA inserted LMA Size: 5.0 Tube type: Oral Number of attempts: 1 Airway Equipment and Method: Oral airway Placement Confirmation: positive ETCO2 and breath sounds checked- equal and bilateral Tube secured with: Tape Dental Injury: Teeth and Oropharynx as per pre-operative assessment

## 2024-01-27 NOTE — Anesthesia Postprocedure Evaluation (Signed)
 Anesthesia Post Note  Patient: Gerald Conner  Procedure(s) Performed: CYSTOSCOPY; BILATERAL RETROGRADE PYELOGRAMS; LEFT URETEROSCOPY; LASER CYSTOLITHOLAPAXY (Bilateral)     Patient location during evaluation: PACU Anesthesia Type: General Level of consciousness: awake and alert, patient cooperative and oriented Pain management: pain level controlled Vital Signs Assessment: post-procedure vital signs reviewed and stable Respiratory status: spontaneous breathing, nonlabored ventilation and respiratory function stable Cardiovascular status: blood pressure returned to baseline and stable Postop Assessment: no apparent nausea or vomiting, able to ambulate and adequate PO intake Anesthetic complications: no   No notable events documented.  Last Vitals:  Vitals:   01/27/24 1715 01/27/24 1730  BP: (!) 136/92 (!) 141/94  Pulse: 66   Resp:    Temp:    SpO2: 93%     Last Pain:  Vitals:   01/27/24 1652  TempSrc:   PainSc: 6                  Almarosa Bohac,E. Lois Ostrom

## 2024-01-27 NOTE — OR Nursing (Signed)
 Stones from procedure were brought to the OR by PACU RN after pt denied wanting them. Dr. Willye Harvey notified, and requested stones be sent for analysis. Pathology order placed and sent to lab.

## 2024-01-27 NOTE — Interval H&P Note (Signed)
 History and Physical Interval Note:  01/27/2024 1:19 PM  Gerald Conner  has presented today for surgery, with the diagnosis of Bladder calculi Ureteral calculi.  The various methods of treatment have been discussed with the patient and family. After consideration of risks, benefits and other options for treatment, the patient has consented to  Procedure(s): CYSTOSCOPY/URETEROSCOPY/HOLMIUM LASER/STENT PLACEMENT (Bilateral), CYSTOLITHOLAPAXY as a surgical intervention.  The patient's history has been reviewed, patient examined, no change in status, stable for surgery.  I have reviewed the patient's chart and labs.  Questions were answered to the patient's satisfaction.     [name]

## 2024-01-28 ENCOUNTER — Encounter (HOSPITAL_COMMUNITY): Payer: Self-pay | Admitting: Urology

## 2024-02-06 LAB — STONE ANALYSIS
Calcium Oxalate Monohydrate: 10 %
Uric Acid Calculi: 90 %
Weight Calculi: 1316 mg

## 2024-02-11 ENCOUNTER — Ambulatory Visit (INDEPENDENT_AMBULATORY_CARE_PROVIDER_SITE_OTHER): Admitting: Urology

## 2024-02-11 ENCOUNTER — Encounter: Payer: Self-pay | Admitting: Urology

## 2024-02-11 VITALS — BP 144/99 | HR 65 | Ht 72.0 in | Wt 270.0 lb

## 2024-02-11 DIAGNOSIS — N138 Other obstructive and reflux uropathy: Secondary | ICD-10-CM

## 2024-02-11 DIAGNOSIS — N401 Enlarged prostate with lower urinary tract symptoms: Secondary | ICD-10-CM

## 2024-02-11 DIAGNOSIS — N210 Calculus in bladder: Secondary | ICD-10-CM

## 2024-02-11 DIAGNOSIS — Z87442 Personal history of urinary calculi: Secondary | ICD-10-CM

## 2024-02-11 DIAGNOSIS — N200 Calculus of kidney: Secondary | ICD-10-CM

## 2024-02-11 LAB — URINALYSIS, ROUTINE W REFLEX MICROSCOPIC
Bilirubin, UA: NEGATIVE
Glucose, UA: NEGATIVE
Ketones, UA: NEGATIVE
Leukocytes,UA: NEGATIVE
Nitrite, UA: NEGATIVE
Protein,UA: NEGATIVE
Specific Gravity, UA: 1.02 (ref 1.005–1.030)
Urobilinogen, Ur: 0.2 mg/dL (ref 0.2–1.0)
pH, UA: 5.5 (ref 5.0–7.5)

## 2024-02-11 LAB — MICROSCOPIC EXAMINATION

## 2024-02-11 NOTE — Progress Notes (Signed)
 Assessment: 1. Bladder calculi   2. Nephrolithiasis   3. BPH with obstruction/lower urinary tract symptoms     Plan: Continue tamsulosin , finasteride, and potassium citrate. Recommend further evaluation with a 24-hour urine given history of uric acid nephrolithiasis. Will contact him with results of 24-hour urine test. Return to office in 3 months.  Chief Complaint:  Chief Complaint  Patient presents with   Nephrolithiasis    History of Present Illness:  Gerald Conner is a 68 y.o. male who is seen for continued evaluation of nephrolithiasis, BPH with LUTS, bladder calculi, and ureteral calculi.  He has a history of nephrolithiasis, elevated PSA, and bladder calculi.  He has had nephrolithiasis since 2007 with 20+ stone episodes.  He has required ureteroscopy x 2.  He was started on potassiums citrate in February 2024.  He has been followed by Dr. Domingo Friend with [facility] and was last seen in March 2025. CT imaging from 2/25 showed multiple left renal stones up to 8 mm in size and three 1 cm bladder stones.  He was initially scheduled for cystolitholapaxy with TURP.  However, his urinary symptoms improved with the addition of Flomax  and the plan was for cystolitholapaxy.  He was seen in the emergency room on 12/11/2023 with left flank pain.  CT imaging showed moderate left hydronephrosis with a 7 mm distal left ureteral stone and an additional 3 mm left UVJ stone as well as multiple bladder stones. He reportedly passed the stones while in the emergency room. No additional left flank pain for several weeks. He was scheduled for surgery but elected to cancel the surgery due to operating room delays.  PSA from 11/20/2023: 1.38  He continued to have intermittent lower urinary tract symptoms with urgency, weak stream, frequency, sensation of incomplete emptying, and intermittent stream.  No dysuria or gross hematuria.  No recent UTIs.  He continued on tamsulosin  0.4 mg  daily. IPSS = 16/4.  He reported passing several stones following his visit. He underwent cystoscopy with bilateral retrograde pyelograms, left ureteroscopy, and laser cystolitholopaxy on 01/27/2024.  3 bladder calculi approximately 1 cm each in size were removed.  No left ureteral calculi were found. Stone analysis: 10% calcium oxalate; 90% uric acid.  He returns today for follow-up.  He has done well since the surgery.  His urinary symptoms have improved.  No dysuria or gross hematuria.  He continue his on tamsulosin , finasteride, and potassium citrate.  Portions of the above documentation were copied from a prior visit for review purposes only.  Past Medical History:  Past Medical History:  Diagnosis Date   AKI (acute kidney injury) (HCC) 06/06/2019   Benign essential HTN 03/14/2021   Chest pain 09/27/2020   Epilepsy (HCC)    Erectile dysfunction 03/22/2013   History of kidney stones 02/06/2017   Hydronephrosis due to obstruction of ureter 09/03/2016   Hypertension    Left ureteral calculus 09/03/2016   Neuropathy 07/23/2021   Obstruction of right ureteropelvic junction (UPJ) due to stone 06/06/2019   RBBB 10/13/2015    Past Surgical History:  Past Surgical History:  Procedure Laterality Date   CYSTOSCOPY/URETEROSCOPY/HOLMIUM LASER/STENT PLACEMENT Bilateral 01/27/2024   Procedure: CYSTOSCOPY; BILATERAL RETROGRADE PYELOGRAMS; LEFT URETEROSCOPY; LASER CYSTOLITHOLAPAXY;  Surgeon: Mellie Sprinkle., MD;  Location: [facility];  Service: Urology;  Laterality: Bilateral;   WRIST ARTHROCENTESIS      Allergies:  Allergies  Allergen Reactions   Shellfish Allergy Anaphylaxis    tilapia   Lisinopril Cough  Family History:  Family History  Problem Relation Age of Onset   Diabetes Mother    Heart disease Mother    Diabetes Sister    Diabetes Brother     Social History:  Social History   Tobacco Use   Smoking status: Never   Smokeless tobacco: Never  Vaping Use    Vaping status: Never Used  Substance Use Topics   Alcohol use: Never   Drug use: Never    ROS: Constitutional:  Negative for fever, chills, weight loss CV: Negative for chest pain, previous MI, hypertension Respiratory:  Negative for shortness of breath, wheezing, sleep apnea, frequent cough GI:  Negative for nausea, vomiting, bloody stool, GERD  Physical exam: BP (!) 144/99   Pulse 65   Ht 6' (1.829 m)   Wt 270 lb (122.5 kg)   BMI 36.62 kg/m  GENERAL APPEARANCE:  Well appearing, well developed, well nourished, NAD HEENT:  Atraumatic, normocephalic, oropharynx clear NECK:  Supple without lymphadenopathy or thyromegaly ABDOMEN:  Soft, non-tender, no masses EXTREMITIES:  Moves all extremities well, without clubbing, cyanosis, or edema NEUROLOGIC:  Alert and oriented x 3, normal gait, CN II-XII grossly intact MENTAL STATUS:  appropriate BACK:  Non-tender to palpation, No CVAT SKIN:  Warm, dry, and intact  Results: U/A:  0-5 WBC, 0-2 RBC, pH 5.5

## 2024-03-03 ENCOUNTER — Ambulatory Visit: Admitting: Urology

## 2024-05-13 ENCOUNTER — Ambulatory Visit: Admitting: Urology

## 2024-05-13 ENCOUNTER — Encounter: Payer: Self-pay | Admitting: Urology

## 2024-05-13 VITALS — BP 142/89 | HR 78 | Ht 72.0 in | Wt 270.0 lb

## 2024-05-13 DIAGNOSIS — N138 Other obstructive and reflux uropathy: Secondary | ICD-10-CM | POA: Diagnosis not present

## 2024-05-13 DIAGNOSIS — N401 Enlarged prostate with lower urinary tract symptoms: Secondary | ICD-10-CM

## 2024-05-13 DIAGNOSIS — N200 Calculus of kidney: Secondary | ICD-10-CM

## 2024-05-13 DIAGNOSIS — N210 Calculus in bladder: Secondary | ICD-10-CM

## 2024-05-13 LAB — URINALYSIS, ROUTINE W REFLEX MICROSCOPIC
Bilirubin, UA: NEGATIVE
Glucose, UA: NEGATIVE
Ketones, UA: NEGATIVE
Leukocytes,UA: NEGATIVE
Nitrite, UA: NEGATIVE
Protein,UA: NEGATIVE
RBC, UA: NEGATIVE
Specific Gravity, UA: 1.025 (ref 1.005–1.030)
Urobilinogen, Ur: 0.2 mg/dL (ref 0.2–1.0)
pH, UA: 5.5 (ref 5.0–7.5)

## 2024-05-13 MED ORDER — FINASTERIDE 5 MG PO TABS
5.0000 mg | ORAL_TABLET | Freq: Every day | ORAL | 3 refills | Status: AC
Start: 2024-05-13 — End: ?

## 2024-05-13 MED ORDER — TAMSULOSIN HCL 0.4 MG PO CAPS: 0.4000 mg | ORAL_CAPSULE | Freq: Every day | ORAL | 3 refills | Status: AC

## 2024-05-13 NOTE — Progress Notes (Signed)
 Assessment: 1. BPH with obstruction/lower urinary tract symptoms   2. Bladder calculi   3. Nephrolithiasis     Plan: Continue tamsulosin , finasteride , and potassium citrate. 24-hour urine given history of uric acid nephrolithiasis. Will contact him with results of 24-hour urine test. Return to office in 6 months.  Chief Complaint:  Chief Complaint  Patient presents with   Nephrolithiasis    History of Present Illness:  Gerald Conner is a 68 y.o. male who is seen for continued evaluation of nephrolithiasis, BPH with LUTS, bladder calculi, and ureteral calculi.  He has a history of nephrolithiasis, elevated PSA, and bladder calculi.  He has had nephrolithiasis since 2007 with 20+ stone episodes.  He has required ureteroscopy x 2.  He was started on potassiums citrate in February 2024.  He has been followed by Dr. Chauncey with [facility] and was last seen in March 2025. CT imaging from 2/25 showed multiple left renal stones up to 8 mm in size and three 1 cm bladder stones.  He was initially scheduled for cystolitholapaxy with TURP.  However, his urinary symptoms improved with the addition of Flomax  and the plan was for cystolitholapaxy.  He was seen in the emergency room on 12/11/2023 with left flank pain.  CT imaging showed moderate left hydronephrosis with a 7 mm distal left ureteral stone and an additional 3 mm left UVJ stone as well as multiple bladder stones. He reportedly passed the stones while in the emergency room. No additional left flank pain for several weeks. He was scheduled for surgery but elected to cancel the surgery due to operating room delays.  PSA from 11/20/2023: 1.38  He continued to have intermittent lower urinary tract symptoms with urgency, weak stream, frequency, sensation of incomplete emptying, and intermittent stream.  No dysuria or gross hematuria.  No recent UTIs.  He continued on tamsulosin  0.4 mg daily. IPSS = 16/4.  He reported passing  several stones following his visit. He underwent cystoscopy with bilateral retrograde pyelograms, left ureteroscopy, and laser cystolitholopaxy on 01/27/2024.  3 bladder calculi approximately 1 cm each in size were removed.  No left ureteral calculi were found. Stone analysis: 10% calcium oxalate; 90% uric acid.  At his visit in June 2025, he was doing well following the surgery.  His urinary symptoms had improved.  No dysuria or gross hematuria.  He continued on tamsulosin , finasteride , and potassium citrate.  He returns today for follow-up.  He continues on tamsulosin  0.4 mg daily, finasteride  5 mg daily.  His lower urinary tract symptoms are stable.  He does have some urgency.  No dysuria or gross hematuria. IPSS = 5/2. He continues on potassium citrate 15 mill equivalents daily.  No recent stone symptoms.  Portions of the above documentation were copied from a prior visit for review purposes only.  Past Medical History:  Past Medical History:  Diagnosis Date   AKI (acute kidney injury) (HCC) 06/06/2019   Benign essential HTN 03/14/2021   Chest pain 09/27/2020   Epilepsy (HCC)    Erectile dysfunction 03/22/2013   History of kidney stones 02/06/2017   Hydronephrosis due to obstruction of ureter 09/03/2016   Hypertension    Left ureteral calculus 09/03/2016   Neuropathy 07/23/2021   Obstruction of right ureteropelvic junction (UPJ) due to stone 06/06/2019   RBBB 10/13/2015    Past Surgical History:  Past Surgical History:  Procedure Laterality Date   CYSTOSCOPY/URETEROSCOPY/HOLMIUM LASER/STENT PLACEMENT Bilateral 01/27/2024   Procedure: CYSTOSCOPY; BILATERAL RETROGRADE PYELOGRAMS; LEFT URETEROSCOPY;  LASER CYSTOLITHOLAPAXY;  Surgeon: Roseann Adine PARAS., MD;  Location: [facility];  Service: Urology;  Laterality: Bilateral;   WRIST ARTHROCENTESIS      Allergies:  Allergies  Allergen Reactions   Shellfish Allergy Anaphylaxis    tilapia   Lisinopril Cough    Family History:   Family History  Problem Relation Age of Onset   Diabetes Mother    Heart disease Mother    Diabetes Sister    Diabetes Brother     Social History:  Social History   Tobacco Use   Smoking status: Never   Smokeless tobacco: Never  Vaping Use   Vaping status: Never Used  Substance Use Topics   Alcohol use: Never   Drug use: Never    ROS: Constitutional:  Negative for fever, chills, weight loss CV: Negative for chest pain, previous MI, hypertension Respiratory:  Negative for shortness of breath, wheezing, sleep apnea, frequent cough GI:  Negative for nausea, vomiting, bloody stool, GERD  Physical exam: BP (!) 142/89   Pulse 78   Ht 6' (1.829 m)   Wt 270 lb (122.5 kg)   BMI 36.62 kg/m  GENERAL APPEARANCE:  Well appearing, well developed, well nourished, NAD HEENT:  Atraumatic, normocephalic, oropharynx clear NECK:  Supple without lymphadenopathy or thyromegaly ABDOMEN:  Soft, non-tender, no masses EXTREMITIES:  Moves all extremities well, without clubbing, cyanosis, or edema NEUROLOGIC:  Alert and oriented x 3, normal gait, CN II-XII grossly intact MENTAL STATUS:  appropriate BACK:  Non-tender to palpation, No CVAT SKIN:  Warm, dry, and intact  Results: U/A: pH 5.5, negative
